# Patient Record
Sex: Female | Born: 1970 | Race: Black or African American | Hispanic: No | Marital: Single | Smoking: Never smoker
Health system: Southern US, Community
[De-identification: ages and names within clinical notes are randomized; demographics above are authoritative.]

## PROBLEM LIST (undated history)

## (undated) DIAGNOSIS — G43909 Migraine, unspecified, not intractable, without status migrainosus: Secondary | ICD-10-CM

## (undated) DIAGNOSIS — R0600 Dyspnea, unspecified: Secondary | ICD-10-CM

## (undated) DIAGNOSIS — Z973 Presence of spectacles and contact lenses: Secondary | ICD-10-CM

## (undated) DIAGNOSIS — D649 Anemia, unspecified: Secondary | ICD-10-CM

## (undated) DIAGNOSIS — R51 Headache: Secondary | ICD-10-CM

## (undated) DIAGNOSIS — R011 Cardiac murmur, unspecified: Secondary | ICD-10-CM

## (undated) DIAGNOSIS — R519 Headache, unspecified: Secondary | ICD-10-CM

## (undated) DIAGNOSIS — R002 Palpitations: Secondary | ICD-10-CM

## (undated) HISTORY — DX: Migraine, unspecified, not intractable, without status migrainosus: G43.909

## (undated) HISTORY — PX: TUBAL LIGATION: SHX77

## (undated) HISTORY — PX: WISDOM TOOTH EXTRACTION: SHX21

---

## 1999-03-21 ENCOUNTER — Emergency Department (HOSPITAL_COMMUNITY): Admission: EM | Admit: 1999-03-21 | Discharge: 1999-03-21 | Payer: Self-pay | Admitting: Emergency Medicine

## 1999-05-21 ENCOUNTER — Other Ambulatory Visit: Admission: RE | Admit: 1999-05-21 | Discharge: 1999-05-21 | Payer: Self-pay | Admitting: *Deleted

## 1999-07-06 ENCOUNTER — Observation Stay (HOSPITAL_COMMUNITY): Admission: AD | Admit: 1999-07-06 | Discharge: 1999-07-07 | Payer: Self-pay | Admitting: Obstetrics and Gynecology

## 1999-07-07 ENCOUNTER — Encounter: Payer: Self-pay | Admitting: Obstetrics and Gynecology

## 1999-08-20 ENCOUNTER — Inpatient Hospital Stay (HOSPITAL_COMMUNITY): Admission: AD | Admit: 1999-08-20 | Discharge: 1999-08-20 | Payer: Self-pay | Admitting: *Deleted

## 1999-08-26 HISTORY — PX: TUBAL LIGATION: SHX77

## 1999-08-29 ENCOUNTER — Encounter: Payer: Self-pay | Admitting: Obstetrics & Gynecology

## 1999-08-29 ENCOUNTER — Ambulatory Visit (HOSPITAL_COMMUNITY): Admission: RE | Admit: 1999-08-29 | Discharge: 1999-08-29 | Payer: Self-pay | Admitting: Obstetrics & Gynecology

## 1999-09-03 ENCOUNTER — Encounter (HOSPITAL_COMMUNITY): Admission: RE | Admit: 1999-09-03 | Discharge: 1999-10-23 | Payer: Self-pay | Admitting: *Deleted

## 1999-09-15 ENCOUNTER — Inpatient Hospital Stay (HOSPITAL_COMMUNITY): Admission: AD | Admit: 1999-09-15 | Discharge: 1999-09-15 | Payer: Self-pay | Admitting: Obstetrics and Gynecology

## 1999-09-23 ENCOUNTER — Inpatient Hospital Stay (HOSPITAL_COMMUNITY): Admission: AD | Admit: 1999-09-23 | Discharge: 1999-09-23 | Payer: Self-pay | Admitting: *Deleted

## 1999-09-29 ENCOUNTER — Encounter: Payer: Self-pay | Admitting: Obstetrics and Gynecology

## 1999-09-29 ENCOUNTER — Inpatient Hospital Stay (HOSPITAL_COMMUNITY): Admission: AD | Admit: 1999-09-29 | Discharge: 1999-09-29 | Payer: Self-pay | Admitting: Obstetrics and Gynecology

## 1999-10-14 ENCOUNTER — Inpatient Hospital Stay (HOSPITAL_COMMUNITY): Admission: AD | Admit: 1999-10-14 | Discharge: 1999-10-14 | Payer: Self-pay | Admitting: Obstetrics and Gynecology

## 1999-10-22 ENCOUNTER — Inpatient Hospital Stay (HOSPITAL_COMMUNITY): Admission: AD | Admit: 1999-10-22 | Discharge: 1999-10-24 | Payer: Self-pay | Admitting: Obstetrics and Gynecology

## 1999-11-28 ENCOUNTER — Other Ambulatory Visit: Admission: RE | Admit: 1999-11-28 | Discharge: 1999-11-28 | Payer: Self-pay | Admitting: *Deleted

## 1999-11-30 ENCOUNTER — Ambulatory Visit (HOSPITAL_COMMUNITY): Admission: RE | Admit: 1999-11-30 | Discharge: 1999-11-30 | Payer: Self-pay | Admitting: *Deleted

## 2002-01-03 ENCOUNTER — Other Ambulatory Visit: Admission: RE | Admit: 2002-01-03 | Discharge: 2002-01-03 | Payer: Self-pay | Admitting: Obstetrics and Gynecology

## 2002-02-02 ENCOUNTER — Encounter: Payer: Self-pay | Admitting: Obstetrics and Gynecology

## 2002-02-02 ENCOUNTER — Encounter: Admission: RE | Admit: 2002-02-02 | Discharge: 2002-02-02 | Payer: Self-pay | Admitting: Obstetrics and Gynecology

## 2006-11-09 ENCOUNTER — Encounter: Admission: RE | Admit: 2006-11-09 | Discharge: 2006-11-09 | Payer: Self-pay | Admitting: Obstetrics

## 2007-02-14 ENCOUNTER — Emergency Department (HOSPITAL_COMMUNITY): Admission: EM | Admit: 2007-02-14 | Discharge: 2007-02-14 | Payer: Self-pay | Admitting: Emergency Medicine

## 2009-09-14 ENCOUNTER — Other Ambulatory Visit: Admission: RE | Admit: 2009-09-14 | Discharge: 2009-09-14 | Payer: Self-pay | Admitting: Family Medicine

## 2009-09-27 ENCOUNTER — Ambulatory Visit (HOSPITAL_COMMUNITY): Admission: RE | Admit: 2009-09-27 | Discharge: 2009-09-27 | Payer: Self-pay | Admitting: Family Medicine

## 2010-11-12 ENCOUNTER — Other Ambulatory Visit (HOSPITAL_COMMUNITY)
Admission: RE | Admit: 2010-11-12 | Discharge: 2010-11-12 | Disposition: A | Discharge status: Home / Self Care | Payer: Medicaid Other | Source: Ambulatory Visit | Attending: Family Medicine | Admitting: Family Medicine

## 2010-11-12 DIAGNOSIS — Z01419 Encounter for gynecological examination (general) (routine) without abnormal findings: Secondary | ICD-10-CM | POA: Insufficient documentation

## 2010-11-12 DIAGNOSIS — Z1159 Encounter for screening for other viral diseases: Secondary | ICD-10-CM | POA: Insufficient documentation

## 2010-11-13 ENCOUNTER — Other Ambulatory Visit (HOSPITAL_COMMUNITY): Payer: Self-pay | Admitting: Family Medicine

## 2010-11-13 DIAGNOSIS — Z1231 Encounter for screening mammogram for malignant neoplasm of breast: Secondary | ICD-10-CM

## 2010-11-21 ENCOUNTER — Ambulatory Visit (HOSPITAL_COMMUNITY): Payer: Medicaid Other | Attending: Family Medicine

## 2010-12-18 ENCOUNTER — Ambulatory Visit (HOSPITAL_COMMUNITY)
Admission: RE | Admit: 2010-12-18 | Discharge: 2010-12-18 | Disposition: A | Discharge status: Home / Self Care | Payer: Medicaid Other | Source: Ambulatory Visit | Attending: Family Medicine | Admitting: Family Medicine

## 2010-12-18 DIAGNOSIS — Z1231 Encounter for screening mammogram for malignant neoplasm of breast: Secondary | ICD-10-CM

## 2011-01-10 NOTE — Op Note (Signed)
[facility]  Patient:    Teresa Lynch, Teresa Lynch                        MRN: [id number] Proc. Date: 11/30/99 Adm. Date:  [id number] Attending:  Donne Hazel                           Operative Report  PREOPERATIVE DIAGNOSIS:       Requests permanent, voluntary sterilization.  POSTOPERATIVE DIAGNOSIS:      Requests permanent, voluntary sterilization.  OPERATION:                    Laparoscopy.  Bilateral tubal ligation by electrofulguration.  SURGEON:                      Willey Blade, M.D.  ASSISTANT:  ANESTHESIA:                   General endotracheal anesthesia.  ESTIMATED BLOOD LOSS:         Less than 20 cc.  COMPLICATIONS:                None.  FINDINGS:                     At the time of laparoscopy, the pelvis was visualized.  The right tube appeared to be adequately ligated.  The left tube had a fimbriated end and was quite long in length.  There was a distal Falope ring of  old, but proximal to this, there was a fimbriated end of the tube with quite some length to it.  The ovaries and uterus were otherwise visualized and noted to be  normal.  The bowel was visualized and noted to be normal.  The appendix was not  identified.  DESCRIPTION OF PROCEDURE:     The patient was taken to the operating room where a general endotracheal anesthetic was administered.  The patient was placed on the operating table in the dorsal lithotomy position.  The abdomen, perineum, and vagina was prepped and draped in the usual sterile fashion with Betadine and sterile drapes.  A Hulka tenaculum was placed inside the intrauterine cavity.  red rubber catheter was used to empty the bladder.  Next, the abdomen was entered through a small transverse infraumbilical skin incision.  A Veress needle was inserted atraumatically into the abdominal cavity and a pneumoperitoneum created. Approximately 1.5 liters of carbon dioxide gas was used to insufflate  the abdomen. After a pneumoperitoneum was created, the Veress needle was removed.  Next, a disposable laparoscopic trocar was inserted atraumatically into the abdominal cavity.  The laparoscope was then inserted with the above noted findings. Carbon dioxide was used to insufflate the abdomen.  First the right tube was ligated.  This was about 3 cm in size at the proximal end.  This was interrupted with what looks like a Falope ring.  The proximal portion of the tube was then cauterized thoroughly in its distal 2 cm.  This was done with the bipolar cautery. This distal 2/3 was thoroughly cauterized until no current passed between the bipolar electrodes.  Next, attention was turned to the left tube.  This is more  than likely the tube that failed for her.  An approximate 3.5 cm segment of this tube was thoroughly cauterized with about four lengths paddelwidths of the Kleppinger bipolar  cautery.  This was done thoroughly and completely incorporating some of the mesosalpinx.  Representative pictures were then taken.  The laparoscope was removed and the gas allowed to escape from the abdomen.  All abdominal instruments were removed.  The small incision was closed first with two interrupted 0 Vicryl sutures on the fascia.  The skin was reapproximated with a running subcuticular stitch of 4-0 Vicryl Rapide.  All vaginal instruments were removed. The patient was awakened, extubated, and taken to the recovery room in good condition.  There were no perioperative complications. DD:  11/30/99 TD:  11/30/99 Job: [id number]

## 2011-01-10 NOTE — H&P (Signed)
[facility]  Patient:    Teresa Lynch, Teresa Lynch                        MRN: [id number] Adm. Date:  [id number] Attending:  Donne Hazel                         History and Physical  HISTORY OF PRESENT ILLNESS:   Ms. Tipping is a 40 year old female, gravida 5, para 5, who is admitted at this time to undergo permanent, voluntary sterilization. The patient had a tubal ligation in 1995 which has failed her.  She has had two vaginal deliveries after her first tubal ligation.  After informed consent, the patient is once again admitted for laparoscopic bilateral tubal ligation.  Risks and benefits as well as failure rate explained.  PAST OBSTETRIC HISTORY:       Normal spontaneous vaginal delivery x 5.  PAST SURGICAL HISTORY:        Bilateral tubal ligation.  PAST MEDICAL HISTORY:         None.  MEDICATIONS:                  1. Vitamins.                               2. Iron.  PHYSICAL EXAMINATION:  VITAL SIGNS:                  Stable.  Blood pressure 114/68, temperature 97.1,  pulse 77, respirations 20.  GENERAL:                      She is a well-developed, well-nourished female in no acute distress.  HEENT:                        Within normal limits.  NECK:                         Supple without adenopathy or thyromegaly.  HEART:                        Regular rate and rhythm without murmurs, rubs, or  gallops.  LUNGS:                        Clear to auscultation.  BREASTS:                      Done recently in the office was normal, but is deferred upon admission.  ABDOMEN:                      Benign with bowel sounds positive.  The abdomen is soft without tenderness, masses, or hernia.  EXTREMITIES: NEUROLOGICAL:    Grossly normal.  PELVIC:                       Normal external, parous female genitalia noted. The vagina and cervix are clear.  The uterus is small, nontender, and mobile. he adnexa are clear.  ADMISSION  DIAGNOSES:          Requests permanent, voluntary sterilization.  PLAN:  1) Laparoscopic bilateral tubal ligation by electrofulguration.  DISCUSSION:                   The risks and benefits of laparoscopy and bilateral tubal ligation discussed.  The risks of bleeding, infection, risks of injury to  surrounding organs discussed at length.  The patient had a previous tubal ligation which failed her.  After informed consent, the patient is now admitted for laparoscopic bilateral tubal ligation with electrofulguration.  The failure rate and procedure discussed. DD:  11/30/99 TD:  11/30/99 Job: [id number]

## 2011-06-11 LAB — WET PREP, GENITAL: Clue Cells Wet Prep HPF POC: NONE SEEN

## 2011-06-11 LAB — HEMOGLOBIN AND HEMATOCRIT, BLOOD
HCT: 32.7 — ABNORMAL LOW
Hemoglobin: 10.7 — ABNORMAL LOW

## 2011-06-11 LAB — GC/CHLAMYDIA PROBE AMP, GENITAL: GC Probe Amp, Genital: NEGATIVE

## 2012-03-01 ENCOUNTER — Emergency Department (HOSPITAL_COMMUNITY)
Admission: EM | Admit: 2012-03-01 | Discharge: 2012-03-01 | Disposition: A | Discharge status: Home Health | Payer: Medicaid Other | Attending: Emergency Medicine | Admitting: Emergency Medicine

## 2012-03-01 ENCOUNTER — Encounter (HOSPITAL_COMMUNITY): Payer: Self-pay | Admitting: Emergency Medicine

## 2012-03-01 DIAGNOSIS — N390 Urinary tract infection, site not specified: Secondary | ICD-10-CM | POA: Insufficient documentation

## 2012-03-01 LAB — POCT PREGNANCY, URINE: Preg Test, Ur: NEGATIVE

## 2012-03-01 LAB — URINALYSIS, ROUTINE W REFLEX MICROSCOPIC
Nitrite: POSITIVE — AB
Specific Gravity, Urine: 1.025 (ref 1.005–1.030)
Urobilinogen, UA: 1 mg/dL (ref 0.0–1.0)
pH: 5.5 (ref 5.0–8.0)

## 2012-03-01 LAB — URINE MICROSCOPIC-ADD ON

## 2012-03-01 MED ORDER — DEXTROSE 5 % IV SOLN
1.0000 g | Freq: Once | INTRAVENOUS | Status: AC
  Administered 2012-03-01: 1 g via INTRAVENOUS
  Filled 2012-03-01: qty 10

## 2012-03-01 MED ORDER — NITROFURANTOIN MONOHYD MACRO 100 MG PO CAPS: 100.0000 mg | ORAL_CAPSULE | Freq: Two times a day (BID) | ORAL | Status: AC

## 2012-03-01 MED ORDER — ONDANSETRON HCL 4 MG/2ML IJ SOLN
INTRAMUSCULAR | Status: AC
  Filled 2012-03-01: qty 2

## 2012-03-01 MED ORDER — ONDANSETRON HCL 4 MG PO TABS: 4.0000 mg | ORAL_TABLET | Freq: Four times a day (QID) | ORAL | Status: AC

## 2012-03-01 MED ORDER — SODIUM CHLORIDE 0.9 % IV BOLUS (SEPSIS)
500.0000 mL | Freq: Once | INTRAVENOUS | Status: AC
  Administered 2012-03-01: 500 mL via INTRAVENOUS

## 2012-03-01 MED ORDER — HYDROMORPHONE HCL PF 1 MG/ML IJ SOLN
1.0000 mg | Freq: Once | INTRAMUSCULAR | Status: AC
  Administered 2012-03-01: 1 mg via INTRAVENOUS
  Filled 2012-03-01: qty 1

## 2012-03-01 MED ORDER — OXYCODONE-ACETAMINOPHEN 5-325 MG PO TABS: 1.0000 | ORAL_TABLET | ORAL | Status: AC | PRN

## 2012-03-01 MED ORDER — ONDANSETRON HCL 4 MG/2ML IJ SOLN
4.0000 mg | Freq: Once | INTRAMUSCULAR | Status: AC
  Administered 2012-03-01: 4 mg via INTRAVENOUS

## 2012-03-01 NOTE — ED Provider Notes (Signed)
History     CSN: [id number]  Arrival date & time 03/01/12  1623   None     Chief Complaint  Patient presents with  . Urinary Tract Infection    (Consider location/radiation/quality/duration/timing/severity/associated sxs/prior treatment) Patient is a 41 y.o. female presenting with urinary tract infection. The history is provided by the patient. No language interpreter was used.  Urinary Tract Infection This is a new problem. The current episode started in the past 7 days. The problem occurs daily. The problem has been gradually worsening. Associated symptoms include chills, nausea and urinary symptoms. Pertinent negatives include no fever or vomiting. Exacerbated by: voiding. Treatments tried: azo.  States that Sunday at 1am started having R flank pain. Burning with urination started 6am today.    States that she went to [facility] and got azo tabs and cystex pills for ? Uti.  Pain above her r flank.  No pmh of kidney stone. pcp Dr. [facility].  No fever.   History reviewed. No pertinent past medical history.  History reviewed. No pertinent past surgical history.  History reviewed. No pertinent family history.  History  Substance Use Topics  . Smoking status: Never Smoker   . Smokeless tobacco: Not on file  . Alcohol Use: No    OB History    Grav Para Term Preterm Abortions TAB SAB Ect Mult Living                  Review of Systems  Constitutional: Positive for chills. Negative for fever.  HENT: Negative.   Eyes: Negative.   Respiratory: Negative.   Cardiovascular: Negative.   Gastrointestinal: Positive for nausea. Negative for vomiting.  Genitourinary: Positive for dysuria, urgency, frequency and flank pain. Negative for hematuria and vaginal discharge.  Neurological: Negative.  Negative for dizziness.  Psychiatric/Behavioral: Negative.   All other systems reviewed and are negative.    Allergies  Review of patient's allergies indicates no known allergies.  Home  Medications   Current Outpatient Rx  Name Route Sig Dispense Refill  . CYSTEX PO Oral Take 2 tablets by mouth daily.    . AZO TABS PO Oral Take 2 tablets by mouth 2 (two) times daily.      BP 140/80  Pulse 91  Temp 98.5 F (36.9 C) (Oral)  Resp 20  SpO2 98%  Physical Exam  Nursing note and vitals reviewed. Constitutional: She is oriented to person, place, and time. She appears well-developed and well-nourished.  HENT:  Head: Normocephalic and atraumatic.  Eyes: Conjunctivae and EOM are normal. Pupils are equal, round, and reactive to light.  Neck: Normal range of motion. Neck supple.  Cardiovascular: Normal rate.   Pulmonary/Chest: Effort normal and breath sounds normal. No respiratory distress.  Abdominal: Soft. Bowel sounds are normal. There is tenderness.       suprpubic tenderness  Musculoskeletal: Normal range of motion. She exhibits no edema and no tenderness.  Neurological: She is alert and oriented to person, place, and time. She has normal reflexes.  Skin: Skin is warm and dry.  Psychiatric: She has a normal mood and affect.    ED Course  Procedures (including critical care time)  Labs Reviewed  URINALYSIS, ROUTINE W REFLEX MICROSCOPIC - Abnormal; Notable for the following:    Color, Urine ORANGE (*)  BIOCHEMICALS MAY BE AFFECTED BY COLOR   APPearance HAZY (*)     Ketones, ur 15 (*)     Nitrite POSITIVE (*)     Leukocytes, UA LARGE (*)  All other components within normal limits  URINE MICROSCOPIC-ADD ON - Abnormal; Notable for the following:    Squamous Epithelial / LPF FEW (*)     Bacteria, UA MANY (*)     All other components within normal limits  POCT PREGNANCY, URINE   No results found.   No diagnosis found.    MDM  + UTI rocephen 1gm IV in the ER.  Macrobid perocet and zofran rx.  Follow up with pcp of choice.  Return if persistant Flank pain, nausea vomiting or fever  for possible CT.     Labs Reviewed  URINALYSIS, ROUTINE W REFLEX  MICROSCOPIC - Abnormal; Notable for the following:    Color, Urine ORANGE (*)  BIOCHEMICALS MAY BE AFFECTED BY COLOR   APPearance HAZY (*)     Ketones, ur 15 (*)     Nitrite POSITIVE (*)     Leukocytes, UA LARGE (*)     All other components within normal limits  URINE MICROSCOPIC-ADD ON - Abnormal; Notable for the following:    Squamous Epithelial / LPF FEW (*)     Bacteria, UA MANY (*)     All other components within normal limits  POCT PREGNANCY, URINE  LAB REPORT - SCANNED          Remi Haggard, NP 03/02/12 1311

## 2012-03-01 NOTE — ED Notes (Addendum)
Pt c/o UTI sx with pain in left flank and urgency with burning with urination x 2 days; pt sts foul smell noted also; pt sts started taking azo yesterday

## 2012-03-04 NOTE — ED Provider Notes (Signed)
Medical screening examination/treatment/procedure(s) were performed by non-physician practitioner and as supervising physician I was immediately available for consultation/collaboration.  Ethelda Chick, MD 03/04/12 [phone]

## 2013-08-04 ENCOUNTER — Other Ambulatory Visit (HOSPITAL_COMMUNITY)
Admission: RE | Admit: 2013-08-04 | Discharge: 2013-08-04 | Disposition: A | Discharge status: Home / Self Care | Payer: BC Managed Care – PPO | Source: Ambulatory Visit | Attending: Family Medicine | Admitting: Family Medicine

## 2013-08-04 DIAGNOSIS — Z01419 Encounter for gynecological examination (general) (routine) without abnormal findings: Secondary | ICD-10-CM | POA: Insufficient documentation

## 2013-08-04 DIAGNOSIS — Z1151 Encounter for screening for human papillomavirus (HPV): Secondary | ICD-10-CM | POA: Insufficient documentation

## 2014-04-07 ENCOUNTER — Other Ambulatory Visit (HOSPITAL_COMMUNITY): Payer: Self-pay | Admitting: Family Medicine

## 2014-04-07 DIAGNOSIS — Z1231 Encounter for screening mammogram for malignant neoplasm of breast: Secondary | ICD-10-CM

## 2014-04-13 ENCOUNTER — Ambulatory Visit (HOSPITAL_COMMUNITY): Payer: BC Managed Care – PPO | Attending: Family Medicine

## 2014-10-09 ENCOUNTER — Encounter (HOSPITAL_COMMUNITY): Payer: Self-pay | Admitting: *Deleted

## 2014-10-09 ENCOUNTER — Emergency Department (HOSPITAL_COMMUNITY)
Admission: EM | Admit: 2014-10-09 | Discharge: 2014-10-09 | Disposition: A | Discharge status: Home / Self Care | Payer: BLUE CROSS/BLUE SHIELD | Source: Home / Self Care | Attending: Family Medicine | Admitting: Family Medicine

## 2014-10-09 ENCOUNTER — Other Ambulatory Visit (HOSPITAL_COMMUNITY)
Admission: RE | Admit: 2014-10-09 | Discharge: 2014-10-09 | Disposition: A | Discharge status: Home / Self Care | Payer: BLUE CROSS/BLUE SHIELD | Source: Ambulatory Visit | Attending: Family Medicine | Admitting: Family Medicine

## 2014-10-09 DIAGNOSIS — Z113 Encounter for screening for infections with a predominantly sexual mode of transmission: Secondary | ICD-10-CM | POA: Diagnosis present

## 2014-10-09 DIAGNOSIS — N766 Ulceration of vulva: Secondary | ICD-10-CM

## 2014-10-09 DIAGNOSIS — N760 Acute vaginitis: Secondary | ICD-10-CM | POA: Diagnosis present

## 2014-10-09 MED ORDER — VALACYCLOVIR HCL 1 G PO TABS: 1000.0000 mg | ORAL_TABLET | Freq: Two times a day (BID) | ORAL | Status: DC

## 2014-10-09 MED ORDER — METRONIDAZOLE 500 MG PO TABS: 500.0000 mg | ORAL_TABLET | Freq: Two times a day (BID) | ORAL | Status: DC

## 2014-10-09 MED ORDER — FLUCONAZOLE 150 MG PO TABS: 150.0000 mg | ORAL_TABLET | Freq: Once | ORAL | Status: DC

## 2014-10-09 NOTE — Discharge Instructions (Signed)
Thank you for coming in today. Take medications as directed. We will call you if anything comes back positive. Return as needed   Bacterial Vaginosis Bacterial vaginosis is a vaginal infection that occurs when the normal balance of bacteria in the vagina is disrupted. It results from an overgrowth of certain bacteria. This is the most common vaginal infection in women of childbearing age. Treatment is important to prevent complications, especially in pregnant women, as it can cause a premature delivery. CAUSES  Bacterial vaginosis is caused by an increase in harmful bacteria that are normally present in smaller amounts in the vagina. Several different kinds of bacteria can cause bacterial vaginosis. However, the reason that the condition develops is not fully understood. RISK FACTORS Certain activities or behaviors can put you at an increased risk of developing bacterial vaginosis, including:  Having a new sex partner or multiple sex partners.  Douching.  Using an intrauterine device (IUD) for contraception. Women do not get bacterial vaginosis from toilet seats, bedding, swimming pools, or contact with objects around them. SIGNS AND SYMPTOMS  Some women with bacterial vaginosis have no signs or symptoms. Common symptoms include:  Grey vaginal discharge.  A fishlike odor with discharge, especially after sexual intercourse.  Itching or burning of the vagina and vulva.  Burning or pain with urination. DIAGNOSIS  Your health care provider will take a medical history and examine the vagina for signs of bacterial vaginosis. A sample of vaginal fluid may be taken. Your health care provider will look at this sample under a microscope to check for bacteria and abnormal cells. A vaginal pH test may also be done.  TREATMENT  Bacterial vaginosis may be treated with antibiotic medicines. These may be given in the form of a pill or a vaginal cream. A second round of antibiotics may be prescribed  if the condition comes back after treatment.  HOME CARE INSTRUCTIONS   Only take over-the-counter or prescription medicines as directed by your health care provider.  If antibiotic medicine was prescribed, take it as directed. Make sure you finish it even if you start to feel better.  Do not have sex until treatment is completed.  Tell all sexual partners that you have a vaginal infection. They should see their health care provider and be treated if they have problems, such as a mild rash or itching.  Practice safe sex by using condoms and only having one sex partner. SEEK MEDICAL CARE IF:   Your symptoms are not improving after 3 days of treatment.  You have increased discharge or pain.  You have a fever. MAKE SURE YOU:   Understand these instructions.  Will watch your condition.  Will get help right away if you are not doing well or get worse. FOR MORE INFORMATION  Centers for Disease Control and Prevention, Division of STD Prevention: AppraiserFraud.fi American Sexual Health Association (ASHA): www.ashastd.org  Document Released: 08/11/2005 Document Revised: 06/01/2013 Document Reviewed: 03/23/2013 [facility] Patient Information 2015 [place], [place]. This information is not intended to replace advice given to you by your health care provider. Make sure you discuss any questions you have with your health care provider.   Genital Herpes Genital herpes is a sexually transmitted disease. This means that it is a disease passed by having sex with an infected person. There is no cure for genital herpes. The time between attacks can be months to years. The virus may live in a person but produce no problems (symptoms). This infection can be passed to a  baby as it travels down the birth canal (vagina). In a newborn, this can cause central nervous system damage, eye damage, or even death. The virus that causes genital herpes is usually HSV-2 virus. The virus that causes oral herpes is usually  HSV-1. The diagnosis (learning what is wrong) is made through culture results. SYMPTOMS  Usually symptoms of pain and itching begin a few days to a week after contact. It first appears as small blisters that progress to small painful ulcers which then scab over and heal after several days. It affects the outer genitalia, birth canal, cervix, penis, anal area, buttocks, and thighs. HOME CARE INSTRUCTIONS   Keep ulcerated areas dry and clean.  Take medications as directed. Antiviral medications can speed up healing. They will not prevent recurrences or cure this infection. These medications can also be taken for suppression if there are frequent recurrences.  While the infection is active, it is contagious. Avoid all sexual contact during active infections.  Condoms may help prevent spread of the herpes virus.  Practice safe sex.  Wash your hands thoroughly after touching the genital area.  Avoid touching your eyes after touching your genital area.  Inform your caregiver if you have had genital herpes and become pregnant. It is your responsibility to insure a safe outcome for your baby in this pregnancy.  Only take over-the-counter or prescription medicines for pain, discomfort, or fever as directed by your caregiver. SEEK MEDICAL CARE IF:   You have a recurrence of this infection.  You do not respond to medications and are not improving.  You have new sources of pain or discharge which have changed from the original infection.  You have an oral temperature above 102 F (38.9 C).  You develop abdominal pain.  You develop eye pain or signs of eye infection. Document Released: 08/08/2000 Document Revised: 11/03/2011 Document Reviewed: 08/29/2009 [facility] Patient Information 2015 [place], [place]. This information is not intended to replace advice given to you by your health care provider. Make sure you discuss any questions you have with your health care provider.

## 2014-10-09 NOTE — ED Provider Notes (Addendum)
Teresa Lynch is a 44 y.o. female who presents to Urgent Care today for vaginal labial ulcer present for about 5 days and quite painful. Patient also notes mild vaginal discharge. No fevers or chills nausea vomiting or diarrhea. No treatments tried yet. No new sexual partners. Patient notes that she has not had sex since October.    No past medical history on file. No past surgical history on file. History  Substance Use Topics  . Smoking status: Never Smoker   . Smokeless tobacco: Not on file  . Alcohol Use: No   ROS as above Medications: No current facility-administered medications for this encounter.   Current Outpatient Prescriptions  Medication Sig Dispense Refill  . fluconazole (DIFLUCAN) 150 MG tablet Take 1 tablet (150 mg total) by mouth once. 1 tablet 1  . Methenamine-Sodium Salicylate (CYSTEX PO) Take 2 tablets by mouth daily.    . metroNIDAZOLE (FLAGYL) 500 MG tablet Take 1 tablet (500 mg total) by mouth 2 (two) times daily. 14 tablet 0  . Phenazopyridine HCl (AZO TABS PO) Take 2 tablets by mouth 2 (two) times daily.    . valACYclovir (VALTREX) 1000 MG tablet Take 1 tablet (1,000 mg total) by mouth 2 (two) times daily. 14 tablet 0   No Known Allergies   Exam:  BP 131/89 mmHg  Pulse 91  Temp(Src) 98.4 F (36.9 C) (Oral)  Resp 16  SpO2 100% Gen: Well NAD  GYN: External genitalia with shallow less than 1 cm ulceration at the right inferior labia inner to touch. Vaginal canal with whitish discharge. Cervix is nontender normal appearing  No results found for this or any previous visit (from the past 24 hour(s)). No results found.  Assessment and Plan: 44 y.o. female with vaginal ulceration and discharge. This is concerning for genital herpes. She has never had an outbreak so this is a primary outbreak. Marland Kitchen HSV culture, vaginal cytology and HIV/RPR pending. Treat with Valtrex, Flagyl, and fluconazole.  Discussed warning signs or symptoms. Please see discharge  instructions. Patient expresses understanding.     Gregor Hams, MD 10/09/14 [place], MD 10/09/14 [phone]

## 2014-10-09 NOTE — ED Notes (Signed)
C/o pain on R labia onset Thur. and noted a bump there on Saturday.  Has clear vaginal discharge onset Wed.  Burns when she passes her urine because the bump is there.

## 2014-10-11 ENCOUNTER — Telehealth (HOSPITAL_COMMUNITY): Payer: Self-pay | Admitting: *Deleted

## 2014-10-11 LAB — CERVICOVAGINAL ANCILLARY ONLY
CHLAMYDIA, DNA PROBE: NEGATIVE
Neisseria Gonorrhea: NEGATIVE
WET PREP (BD AFFIRM): NEGATIVE
Wet Prep (BD Affirm): NEGATIVE
Wet Prep (BD Affirm): POSITIVE — AB

## 2014-10-11 LAB — HERPES SIMPLEX VIRUS CULTURE: CULTURE: DETECTED

## 2014-10-11 LAB — HIV ANTIBODY (ROUTINE TESTING W REFLEX): HIV SCREEN 4TH GENERATION: NONREACTIVE

## 2014-10-11 NOTE — ED Notes (Addendum)
GC/Chlamydia neg., Affirm: Candida and Gardnerella neg., Trich pos., Herpes culture: Herpes Simplex Type 2 detected, HIV non-reactive.  Pt. adequately treated with Flagyl and Valtrex.  I called pt. and left a message to call.  Call 1. Roselyn Meier 10/11/2014

## 2014-10-12 LAB — RPR: RPR Ser Ql: NONREACTIVE

## 2014-10-13 NOTE — ED Notes (Addendum)
RPR non-reactive.  I called pt. and left a message to call.  Call 2. 10/13/2014 I called pt. Pt. verified x 2 and given results.  Pt. told she was adequately treated for Trich with Flagyl and Valtrex for the Herpes.  Pt. instructed to notify her partner of both infections, that she can pass the Herpes virus even when she doesn't have an outbreak, so always practice safe sex and to get treated for each outbreak with Acyclovir or Valtrex. Instructed to tell her PCP, and he can give her a 1 yr. Rx. to take at the beginning of each outbreak. he can also give suppressive treatment if having frequent outbreaks. Pt. voiced understanding. Roselyn Meier 10/17/2014

## 2015-05-23 ENCOUNTER — Other Ambulatory Visit: Payer: Self-pay | Admitting: Obstetrics & Gynecology

## 2015-06-04 ENCOUNTER — Encounter (HOSPITAL_COMMUNITY): Payer: Self-pay

## 2015-06-05 ENCOUNTER — Encounter (HOSPITAL_COMMUNITY): Payer: Self-pay | Admitting: Anesthesiology

## 2015-07-05 ENCOUNTER — Ambulatory Visit (HOSPITAL_COMMUNITY)
Admission: RE | Admit: 2015-07-05 | Payer: BLUE CROSS/BLUE SHIELD | Source: Ambulatory Visit | Admitting: Obstetrics & Gynecology

## 2015-07-05 ENCOUNTER — Encounter (HOSPITAL_COMMUNITY): Admission: RE | Payer: Self-pay | Source: Ambulatory Visit

## 2015-07-05 HISTORY — DX: Headache: R51

## 2015-07-05 HISTORY — DX: Anemia, unspecified: D64.9

## 2015-07-05 HISTORY — DX: Headache, unspecified: R51.9

## 2015-07-05 HISTORY — DX: Cardiac murmur, unspecified: R01.1

## 2015-07-05 SURGERY — DILATATION & CURETTAGE/HYSTEROSCOPY WITH NOVASURE ABLATION
Anesthesia: Choice

## 2015-08-07 ENCOUNTER — Encounter (HOSPITAL_COMMUNITY): Payer: Self-pay

## 2015-08-17 ENCOUNTER — Ambulatory Visit (HOSPITAL_COMMUNITY)
Admission: RE | Admit: 2015-08-17 | Payer: BLUE CROSS/BLUE SHIELD | Source: Ambulatory Visit | Admitting: Obstetrics & Gynecology

## 2015-08-17 SURGERY — DILATATION & CURETTAGE/HYSTEROSCOPY WITH NOVASURE ABLATION
Anesthesia: Choice

## 2015-09-09 ENCOUNTER — Emergency Department (HOSPITAL_COMMUNITY)
Admission: EM | Admit: 2015-09-09 | Discharge: 2015-09-09 | Disposition: A | Discharge status: Home / Self Care | Payer: Managed Care, Other (non HMO) | Source: Home / Self Care | Attending: Emergency Medicine | Admitting: Emergency Medicine

## 2015-09-09 ENCOUNTER — Encounter (HOSPITAL_COMMUNITY): Payer: Self-pay | Admitting: Emergency Medicine

## 2015-09-09 DIAGNOSIS — A499 Bacterial infection, unspecified: Secondary | ICD-10-CM

## 2015-09-09 DIAGNOSIS — N760 Acute vaginitis: Secondary | ICD-10-CM | POA: Diagnosis not present

## 2015-09-09 DIAGNOSIS — R112 Nausea with vomiting, unspecified: Secondary | ICD-10-CM

## 2015-09-09 DIAGNOSIS — B9689 Other specified bacterial agents as the cause of diseases classified elsewhere: Secondary | ICD-10-CM

## 2015-09-09 LAB — POCT URINALYSIS DIP (DEVICE)
Bilirubin Urine: NEGATIVE
Glucose, UA: NEGATIVE mg/dL
Leukocytes, UA: NEGATIVE
NITRITE: NEGATIVE
PH: 7 (ref 5.0–8.0)
PROTEIN: NEGATIVE mg/dL
SPECIFIC GRAVITY, URINE: 1.02 (ref 1.005–1.030)
UROBILINOGEN UA: 2 mg/dL — AB (ref 0.0–1.0)

## 2015-09-09 MED ORDER — FLUCONAZOLE 150 MG PO TABS
150.0000 mg | ORAL_TABLET | Freq: Once | ORAL | Status: DC
Start: 2015-09-09 — End: 2015-10-18

## 2015-09-09 MED ORDER — ONDANSETRON 4 MG PO TBDP
4.0000 mg | ORAL_TABLET | Freq: Once | ORAL | Status: AC
  Administered 2015-09-09: 4 mg via ORAL

## 2015-09-09 MED ORDER — METRONIDAZOLE 500 MG PO TABS: 500.0000 mg | ORAL_TABLET | Freq: Two times a day (BID) | ORAL | Status: DC

## 2015-09-09 MED ORDER — ONDANSETRON 4 MG PO TBDP
ORAL_TABLET | ORAL | Status: AC
  Filled 2015-09-09: qty 1

## 2015-09-09 MED ORDER — ONDANSETRON 4 MG PO TBDP: 4.0000 mg | ORAL_TABLET | Freq: Three times a day (TID) | ORAL | Status: DC | PRN

## 2015-09-09 NOTE — ED Provider Notes (Signed)
CSN: [id number]     Arrival date & time 09/09/15  1450 History   First MD Initiated Contact with Patient 09/09/15 1541     Chief Complaint  Patient presents with  . Emesis   (Consider location/radiation/quality/duration/timing/severity/associated sxs/prior Treatment) HPI  She is a 45 year old woman here for evaluation of vomiting. She states that she had about a weeklong episode of vomiting in December. This resolved, but has restarted 3 days ago. She states anytime she tries to eat or drink anything it just comes back up. She denies any diarrhea or constipation. She had a normal bowel movement yesterday. She denies abdominal pain, but does state her muscles feel sore from dry heaving. There has been no blood in the vomit.  She denies any fevers or chills. Denies any possibility of pregnancy.  Denies dizziness.  She also reports a vaginal discharge with a slightly fishy odor for the last week. No itching or pain.  Past Medical History  Diagnosis Date  . Heart murmur   . Headache     Migraines  . Anemia    Past Surgical History  Procedure Laterality Date  . Tubal ligation    . Wisdom tooth extraction     Family History  Problem Relation Age of Onset  . Cancer Mother     breast   Social History  Substance Use Topics  . Smoking status: Never Smoker   . Smokeless tobacco: Never Used  . Alcohol Use: Yes     Comment: social   OB History    No data available     Review of Systems As in history of present illness Allergies  Review of patient's allergies indicates no known allergies.  Home Medications   Prior to Admission medications   Medication Sig Start Date End Date Taking? Authorizing Provider  fluconazole (DIFLUCAN) 150 MG tablet Take 1 tablet (150 mg total) by mouth once. Take 2nd pill if symptoms not completely resolved in 3 days. 09/09/15   Melony Overly, MD  metroNIDAZOLE (FLAGYL) 500 MG tablet Take 1 tablet (500 mg total) by mouth 2 (two) times daily. 09/09/15   Melony Overly, MD  ondansetron (ZOFRAN-ODT) 4 MG disintegrating tablet Take 1 tablet (4 mg total) by mouth every 8 (eight) hours as needed for nausea or vomiting. 09/09/15   Melony Overly, MD   Meds Ordered and Administered this Visit   Medications  ondansetron (ZOFRAN-ODT) disintegrating tablet 4 mg (4 mg Oral Given 09/09/15 1613)    BP 149/89 mmHg  Pulse 99  Temp(Src) 98 F (36.7 C) (Oral)  SpO2 99%  LMP 08/02/2015 No data found.   Physical Exam  Constitutional: She is oriented to person, place, and time. She appears well-developed and well-nourished. No distress.  Cardiovascular: Normal rate, regular rhythm and normal heart sounds.   No murmur heard. Pulmonary/Chest: Effort normal and breath sounds normal. No respiratory distress. She has no wheezes. She has no rales.  Abdominal: Bowel sounds are normal. She exhibits no distension. There is no tenderness. There is no rebound and no guarding.  Genitourinary:  Pelvic exam deferred  Neurological: She is alert and oriented to person, place, and time.    ED Course  Procedures (including critical care time)  Labs Review Labs Reviewed  POCT URINALYSIS DIP (DEVICE) - Abnormal; Notable for the following:    Ketones, ur TRACE (*)    Hgb urine dipstick MODERATE (*)    Urobilinogen, UA 2.0 (*)    All other components within  normal limits    Imaging Review No results found.   MDM   1. Non-intractable vomiting with nausea, vomiting of unspecified type   2. BV (bacterial vaginosis)    She reports improved nausea after the Zofran. She is able to tolerate ginger ale here. Symptomatic treatment for likely viral stomach bug with Zofran. Flagyl for presumed BV. Prescription given for Diflucan to use if she develops symptoms of a yeast infection. Recommended follow-up with PCP if her vomiting does not improve over the next 3-4 days.    Melony Overly, MD 09/09/15 [phone]

## 2015-09-09 NOTE — ED Notes (Signed)
Pt reports intermittent emesis onset 12/22 that lasted x1 week Emesis restarted Thursday, 1/12, associated w/abd pain, chills Reports she has had x2 episodes of emesis today A&O x4... No acute distress.

## 2015-09-09 NOTE — Discharge Instructions (Signed)
You likely have a stomach virus causing your vomiting. Take Zofran every 8 hours as needed for nausea and vomiting. This should improve over the next 3-4 days.  Take Flagyl one pill twice a day for the discharge. Once you have finished the Flagyl, you can take the Diflucan if you develop symptoms of a yeast infection.  If your vomiting is persistent, please follow-up with your primary care doctor.

## 2015-10-03 ENCOUNTER — Other Ambulatory Visit: Payer: Self-pay | Admitting: Obstetrics & Gynecology

## 2015-10-04 ENCOUNTER — Other Ambulatory Visit: Payer: Self-pay | Admitting: Obstetrics & Gynecology

## 2015-10-08 ENCOUNTER — Encounter (HOSPITAL_COMMUNITY): Payer: Self-pay

## 2015-10-18 ENCOUNTER — Ambulatory Visit (HOSPITAL_COMMUNITY)
Admission: RE | Admit: 2015-10-18 | Discharge: 2015-10-18 | Disposition: A | Discharge status: Home / Self Care | Payer: Managed Care, Other (non HMO) | Source: Ambulatory Visit | Attending: Obstetrics & Gynecology | Admitting: Obstetrics & Gynecology

## 2015-10-18 ENCOUNTER — Ambulatory Visit (HOSPITAL_COMMUNITY): Payer: Managed Care, Other (non HMO) | Admitting: Anesthesiology

## 2015-10-18 ENCOUNTER — Encounter (HOSPITAL_COMMUNITY)
Admission: RE | Disposition: A | Discharge status: Home / Self Care | Payer: Self-pay | Source: Ambulatory Visit | Attending: Obstetrics & Gynecology

## 2015-10-18 ENCOUNTER — Encounter (HOSPITAL_COMMUNITY): Payer: Self-pay | Admitting: *Deleted

## 2015-10-18 DIAGNOSIS — N920 Excessive and frequent menstruation with regular cycle: Secondary | ICD-10-CM | POA: Insufficient documentation

## 2015-10-18 DIAGNOSIS — D500 Iron deficiency anemia secondary to blood loss (chronic): Secondary | ICD-10-CM | POA: Diagnosis not present

## 2015-10-18 DIAGNOSIS — N840 Polyp of corpus uteri: Secondary | ICD-10-CM | POA: Diagnosis not present

## 2015-10-18 HISTORY — PX: DILITATION & CURRETTAGE/HYSTROSCOPY WITH NOVASURE ABLATION: SHX5568

## 2015-10-18 LAB — CBC
HEMATOCRIT: 27.6 % — AB (ref 36.0–46.0)
Hemoglobin: 8.3 g/dL — ABNORMAL LOW (ref 12.0–15.0)
MCH: 22.1 pg — ABNORMAL LOW (ref 26.0–34.0)
MCHC: 30.1 g/dL (ref 30.0–36.0)
MCV: 73.4 fL — AB (ref 78.0–100.0)
Platelets: 410 10*3/uL — ABNORMAL HIGH (ref 150–400)
RBC: 3.76 MIL/uL — ABNORMAL LOW (ref 3.87–5.11)
RDW: 17.9 % — AB (ref 11.5–15.5)
WBC: 5.3 10*3/uL (ref 4.0–10.5)

## 2015-10-18 LAB — PREGNANCY, URINE: Preg Test, Ur: NEGATIVE

## 2015-10-18 SURGERY — DILATATION & CURETTAGE/HYSTEROSCOPY WITH NOVASURE ABLATION
Anesthesia: General | Site: Vagina

## 2015-10-18 MED ORDER — LIDOCAINE HCL (CARDIAC) 20 MG/ML IV SOLN
INTRAVENOUS | Status: AC
  Filled 2015-10-18: qty 5

## 2015-10-18 MED ORDER — OXYCODONE-ACETAMINOPHEN 5-325 MG PO TABS: 1.0000 | ORAL_TABLET | ORAL | Status: DC | PRN

## 2015-10-18 MED ORDER — LACTATED RINGERS IR SOLN
Status: DC | PRN
  Administered 2015-10-18: 3000 mL

## 2015-10-18 MED ORDER — CHLOROPROCAINE HCL 1 % IJ SOLN
INTRAMUSCULAR | Status: DC | PRN
  Administered 2015-10-18: 25 mL

## 2015-10-18 MED ORDER — PROMETHAZINE HCL 25 MG/ML IJ SOLN: 6.2500 mg | INTRAMUSCULAR | Status: DC | PRN

## 2015-10-18 MED ORDER — LACTATED RINGERS IV SOLN
INTRAVENOUS | Status: DC
  Administered 2015-10-18: 08:00:00 via INTRAVENOUS

## 2015-10-18 MED ORDER — DEXAMETHASONE SODIUM PHOSPHATE 4 MG/ML IJ SOLN
INTRAMUSCULAR | Status: AC
  Filled 2015-10-18: qty 1

## 2015-10-18 MED ORDER — FENTANYL CITRATE (PF) 100 MCG/2ML IJ SOLN
INTRAMUSCULAR | Status: AC
  Filled 2015-10-18: qty 2

## 2015-10-18 MED ORDER — LIDOCAINE HCL (CARDIAC) 20 MG/ML IV SOLN
INTRAVENOUS | Status: DC | PRN
  Administered 2015-10-18: 100 mg via INTRAVENOUS

## 2015-10-18 MED ORDER — FENTANYL CITRATE (PF) 100 MCG/2ML IJ SOLN
25.0000 ug | INTRAMUSCULAR | Status: DC | PRN
  Administered 2015-10-18: 25 ug via INTRAVENOUS

## 2015-10-18 MED ORDER — PROPOFOL 10 MG/ML IV BOLUS
INTRAVENOUS | Status: AC
  Filled 2015-10-18: qty 20

## 2015-10-18 MED ORDER — KETOROLAC TROMETHAMINE 30 MG/ML IJ SOLN
INTRAMUSCULAR | Status: DC | PRN
  Administered 2015-10-18: 30 mg via INTRAVENOUS

## 2015-10-18 MED ORDER — CHLOROPROCAINE HCL 1 % IJ SOLN
INTRAMUSCULAR | Status: AC
  Filled 2015-10-18: qty 30

## 2015-10-18 MED ORDER — KETOROLAC TROMETHAMINE 30 MG/ML IJ SOLN
INTRAMUSCULAR | Status: AC
  Filled 2015-10-18: qty 1

## 2015-10-18 MED ORDER — ONDANSETRON HCL 4 MG/2ML IJ SOLN
INTRAMUSCULAR | Status: DC | PRN
  Administered 2015-10-18: 4 mg via INTRAVENOUS

## 2015-10-18 MED ORDER — MIDAZOLAM HCL 2 MG/2ML IJ SOLN
INTRAMUSCULAR | Status: AC
  Filled 2015-10-18: qty 2

## 2015-10-18 MED ORDER — MIDAZOLAM HCL 5 MG/5ML IJ SOLN
INTRAMUSCULAR | Status: DC | PRN
  Administered 2015-10-18: 2 mg via INTRAVENOUS

## 2015-10-18 MED ORDER — OXYCODONE-ACETAMINOPHEN 7.5-325 MG PO TABS: 1.0000 | ORAL_TABLET | ORAL | Status: DC | PRN

## 2015-10-18 MED ORDER — DEXAMETHASONE SODIUM PHOSPHATE 4 MG/ML IJ SOLN
INTRAMUSCULAR | Status: DC | PRN
  Administered 2015-10-18: 4 mg via INTRAVENOUS

## 2015-10-18 MED ORDER — FENTANYL CITRATE (PF) 100 MCG/2ML IJ SOLN
INTRAMUSCULAR | Status: DC | PRN
  Administered 2015-10-18 (×2): 50 ug via INTRAVENOUS

## 2015-10-18 MED ORDER — ONDANSETRON HCL 4 MG/2ML IJ SOLN
INTRAMUSCULAR | Status: AC
  Filled 2015-10-18: qty 2

## 2015-10-18 MED ORDER — PROPOFOL 10 MG/ML IV BOLUS
INTRAVENOUS | Status: DC | PRN
  Administered 2015-10-18: 200 mg via INTRAVENOUS

## 2015-10-18 MED ORDER — SCOPOLAMINE 1 MG/3DAYS TD PT72
1.0000 | MEDICATED_PATCH | Freq: Once | TRANSDERMAL | Status: DC
  Administered 2015-10-18: 1.5 mg via TRANSDERMAL

## 2015-10-18 MED ORDER — SCOPOLAMINE 1 MG/3DAYS TD PT72
MEDICATED_PATCH | TRANSDERMAL | Status: AC
  Administered 2015-10-18: 1.5 mg via TRANSDERMAL
  Filled 2015-10-18: qty 1

## 2015-10-18 SURGICAL SUPPLY — 15 items
ABLATOR ENDOMETRIAL BIPOLAR (ABLATOR) ×3 IMPLANT
CATH ROBINSON RED A/P 16FR (CATHETERS) ×3 IMPLANT
CLOTH BEACON ORANGE TIMEOUT ST (SAFETY) ×3 IMPLANT
CONTAINER PREFILL 10% NBF 60ML (FORM) ×2 IMPLANT
GLOVE BIO SURGEON STRL SZ 6.5 (GLOVE) ×2 IMPLANT
GLOVE BIO SURGEONS STRL SZ 6.5 (GLOVE) ×1
GLOVE BIOGEL PI IND STRL 7.0 (GLOVE) ×3 IMPLANT
GLOVE BIOGEL PI INDICATOR 7.0 (GLOVE) ×6
GOWN STRL REUS W/TWL LRG LVL3 (GOWN DISPOSABLE) ×6 IMPLANT
PACK VAGINAL MINOR WOMEN LF (CUSTOM PROCEDURE TRAY) ×3 IMPLANT
PAD OB MATERNITY 4.3X12.25 (PERSONAL CARE ITEMS) ×3 IMPLANT
TOWEL OR 17X24 6PK STRL BLUE (TOWEL DISPOSABLE) ×6 IMPLANT
TUBING AQUILEX INFLOW (TUBING) ×3 IMPLANT
TUBING AQUILEX OUTFLOW (TUBING) ×3 IMPLANT
WATER STERILE IRR 1000ML POUR (IV SOLUTION) ×3 IMPLANT

## 2015-10-18 NOTE — Discharge Instructions (Signed)

## 2015-10-18 NOTE — Op Note (Signed)
10/18/2015  9:33 AM  PATIENT:  Teresa Lynch  45 y.o. female  PRE-OPERATIVE DIAGNOSIS:  Refractory Menorrhagia with Secondary Anemia  POST-OPERATIVE DIAGNOSIS:  Refractory Menorrhagia with Secondary Anemia  PROCEDURE:  Procedure(s): DILATATION & CURETTAGE/HYSTEROSCOPY WITH NOVASURE ENDOMETRIAL ABLATION  SURGEON:  Surgeon(s): Princess Bruins, MD  ASSISTANTS: none   ANESTHESIA:   general with Laryngeal Mask  PROCEDURE:  Under general anesthesia with laryngeal mask the patient is an lithotomy position. If she is prepped with Betadine on the suprapubic, vulvar and vaginal areas. She is draped as usual. The bladder was catheterized. The vaginal exam revealed an anteverted uterus normal volume no adnexal mass.  The speculum is inserted in the vagina and the anterior lip of the cervix is grasped with a tenaculum. A paracervical block is done with Nesacaine 1% a total of 25 cc at 4 and 8:00.  The hysterometry is at 10 cm with a cervical length at 4 cm for a cavity length of 6 cm. Dilation of the cervix with Hegar dilators up to #31 without difficulty.  The diagnostic hysteroscope is inserted in the intrauterine cavity.  Both ostia are well visualized and no intrauterine lesion is present.  The hysteroscope is therefore removed.  A systematic curettage of the intrauterine cavity is done with a sharp curet on all surfaces.The endometrial curettings are sent to pathology.  The NovaSure endometrial ablation system is inserted in the intrauterine cavity.  The width is measured at 4.3 cm.  The security test is passed successfully.  The endometrial ablation is done with a power of 142 W for 129 seconds.  The instrument is easily removed.  The tenaculum is removed from the cervix.  Silver nitrate is used to control hemostasis where the tenaculum was on the cervix.  The speculum is removed.  The patient is brought to recovery room in good and stable status.  ESTIMATED BLOOD LOSS: 25 cc   Intake/Output  Summary (Last 24 hours) at 10/18/15 0933 Last data filed at 10/18/15 [id number]  Gross per 24 hour  Intake    600 ml  Output     75 ml  Net    525 ml     BLOOD ADMINISTERED:none   LOCAL MEDICATIONS USED:  Nesacaine 1% 25 cc Paracervical block  SPECIMEN:  Source of Specimen:  Endometrial Curettings  DISPOSITION OF SPECIMEN:  PATHOLOGY  COUNTS:  YES  PLAN OF CARE: Transfer to PACU  Princess Bruins MD  10/18/2015 at 9:35 am

## 2015-10-18 NOTE — Anesthesia Preprocedure Evaluation (Addendum)
Anesthesia Evaluation  Patient identified by MRN, date of birth, ID band Patient awake    Reviewed: Allergy & Precautions, NPO status , Patient's Chart, lab work & pertinent test results  History of Anesthesia Complications Negative for: history of anesthetic complications  Airway Mallampati: II  TM Distance: >3 FB Neck ROM: Full    Dental  (+) Teeth Intact, Dental Advisory Given, Chipped,    Pulmonary neg pulmonary ROS,    Pulmonary exam normal breath sounds clear to auscultation       Cardiovascular Exercise Tolerance: Good negative cardio ROS Normal cardiovascular exam Rhythm:Regular Rate:Normal     Neuro/Psych  Headaches, negative psych ROS   GI/Hepatic negative GI ROS, Neg liver ROS,   Endo/Other  Obesity   Renal/GU negative Renal ROS  negative genitourinary   Musculoskeletal negative musculoskeletal ROS (+)   Abdominal   Peds  Hematology  (+) Blood dyscrasia, Sickle cell trait and anemia ,   Anesthesia Other Findings Day of surgery medications reviewed with the patient.  Menorrhagia   Reproductive/Obstetrics negative OB ROS                           Anesthesia Physical Anesthesia Plan  ASA: II  Anesthesia Plan: General   Post-op Pain Management:    Induction: Intravenous  Airway Management Planned: LMA  Additional Equipment:   Intra-op Plan:   Post-operative Plan: Extubation in OR  Informed Consent: I have reviewed the patients History and Physical, chart, labs and discussed the procedure including the risks, benefits and alternatives for the proposed anesthesia with the patient or authorized representative who has indicated his/her understanding and acceptance.   Dental advisory given  Plan Discussed with: CRNA  Anesthesia Plan Comments: (Risks/benefits of general anesthesia discussed with patient including risk of damage to teeth, lips, gum, and tongue,  nausea/vomiting, allergic reactions to medications, and the possibility of heart attack, stroke and death.  All patient questions answered.  Patient wishes to proceed.)        Anesthesia Quick Evaluation

## 2015-10-18 NOTE — Transfer of Care (Signed)
Immediate Anesthesia Transfer of Care Note  Patient: Teresa Lynch  Procedure(s) Performed: Procedure(s): DILATATION & CURETTAGE/HYSTEROSCOPY WITH NOVASURE ABLATION (N/A)  Patient Location: PACU  Anesthesia Type:General  Level of Consciousness: awake, alert  and oriented  Airway & Oxygen Therapy: Patient Spontanous Breathing and Patient connected to nasal cannula oxygen  Post-op Assessment: Report given to RN  Post vital signs: Reviewed  Last Vitals:  Filed Vitals:   10/18/15 0740  BP: 133/94  Pulse: 85  Temp: 36.7 C  Resp: 18    Complications: No apparent anesthesia complications

## 2015-10-18 NOTE — Anesthesia Postprocedure Evaluation (Signed)
Anesthesia Post Note  Patient: Teresa Lynch  Procedure(s) Performed: Procedure(s) (LRB): DILATATION & CURETTAGE/HYSTEROSCOPY WITH NOVASURE ABLATION (N/A)  Patient location during evaluation: PACU Anesthesia Type: General Level of consciousness: awake and alert Pain management: pain level controlled Vital Signs Assessment: post-procedure vital signs reviewed and stable Respiratory status: spontaneous breathing, nonlabored ventilation, respiratory function stable and patient connected to nasal cannula oxygen Cardiovascular status: blood pressure returned to baseline and stable Postop Assessment: no signs of nausea or vomiting Anesthetic complications: no    Last Vitals:  Filed Vitals:   10/18/15 1030 10/18/15 1045  BP: 118/76 123/79  Pulse: 69 81  Temp:    Resp: 24 19    Last Pain:  Filed Vitals:   10/18/15 1049  PainSc: 3                  Catalina Gravel

## 2015-10-18 NOTE — Anesthesia Procedure Notes (Signed)
Procedure Name: LMA Insertion Date/Time: 10/18/2015 9:05 AM Performed by: Talbot Grumbling Pre-anesthesia Checklist: Patient identified, Emergency Drugs available, Suction available and Patient being monitored Patient Re-evaluated:Patient Re-evaluated prior to inductionOxygen Delivery Method: Circle system utilized Preoxygenation: Pre-oxygenation with 100% oxygen Intubation Type: IV induction Ventilation: Mask ventilation without difficulty LMA: LMA inserted LMA Size: 4.0 Number of attempts: 1 Placement Confirmation: positive ETCO2 and breath sounds checked- equal and bilateral Tube secured with: Tape Dental Injury: Teeth and Oropharynx as per pre-operative assessment

## 2015-10-18 NOTE — H&P (Signed)
Teresa Lynch is an 45 y.o. female G74P5  RP:  Refractory Menorrhagia with anemia for [facility] D+C, Novasure Endometrial Ablation  Pertinent Gynecological History: Menses: Heavy and irregular Contraception: DepoProvera/Aviane Blood transfusions: None  Last mammogram: wnl Last pap: wnl  OB History:  G5P5   Menstrual History:  Patient's last menstrual period was 10/05/2015 (exact date).    Past Medical History  Diagnosis Date  . Headache     Migraines  . Anemia   . Heart murmur     only when pregnant in 2001  . Vaginal delivery 1991, 1993, 1994, 1998, 2001    Past Surgical History  Procedure Laterality Date  . Tubal ligation    . Wisdom tooth extraction      Family History  Problem Relation Age of Onset  . Cancer Mother     breast    Social History:  reports that she has never smoked. She has never used smokeless tobacco. She reports that she drinks alcohol. She reports that she does not use illicit drugs.  Allergies: No Known Allergies  Prescriptions prior to admission  Medication Sig Dispense Refill Last Dose  . fluconazole (DIFLUCAN) 150 MG tablet Take 1 tablet (150 mg total) by mouth once. Take 2nd pill if symptoms not completely resolved in 3 days. (Patient not taking: Reported on 10/06/2015) 2 tablet 0   . metroNIDAZOLE (FLAGYL) 500 MG tablet Take 1 tablet (500 mg total) by mouth 2 (two) times daily. (Patient not taking: Reported on 10/06/2015) 14 tablet 0   . ondansetron (ZOFRAN-ODT) 4 MG disintegrating tablet Take 1 tablet (4 mg total) by mouth every 8 (eight) hours as needed for nausea or vomiting. (Patient not taking: Reported on 10/06/2015) 20 tablet 0     ROS  Neg  Blood pressure 133/94, pulse 85, temperature 98 F (36.7 C), temperature source Oral, resp. rate 18, height 5\' 6"  (1.676 m), weight 193 lb (87.544 kg), last menstrual period 10/05/2015, SpO2 99 %. Physical Exam  Results for orders placed or performed during the hospital encounter of 10/18/15 (from  the past 24 hour(s))  CBC     Status: Abnormal   Collection Time: 10/18/15  7:30 AM  Result Value Ref Range   WBC 5.3 4.0 - 10.5 K/uL   RBC 3.76 (L) 3.87 - 5.11 MIL/uL   Hemoglobin 8.3 (L) 12.0 - 15.0 g/dL   HCT 27.6 (L) 36.0 - 46.0 %   MCV 73.4 (L) 78.0 - 100.0 fL   MCH 22.1 (L) 26.0 - 34.0 pg   MCHC 30.1 30.0 - 36.0 g/dL   RDW 17.9 (H) 11.5 - 15.5 %   Platelets 410 (H) 150 - 400 K/uL  Pregnancy, urine     Status: None   Collection Time: 10/18/15  7:31 AM  Result Value Ref Range   Preg Test, Ur NEGATIVE NEGATIVE    EBx Benign  Pelvic US Uterus and ovaries wnl.  Assessment/Plan: Menorrhagia with secondary anemia resistant to medical Rx for [facility] D+C, Endometrial Ablation.  Surgery and risks reviewed.  Monisha Siebel,MARIE-LYNE 10/18/2015, 8:35 AM

## 2015-10-18 NOTE — Discharge Summary (Signed)
  Physician Discharge Summary  Patient ID: Teresa Lynch MRN: [id number] DOB/AGE: 31-Jan-1971 45 y.o.  Admit date: 10/18/2015 Discharge date: 10/18/2015  Admission Diagnoses: Refractory Menorrhagia with Secondary Anemia  Discharge Diagnoses: Refractory Menorrhagia with Secondary Anemia        Active Problems:   * No active hospital problems. *   Discharged Condition: good  Hospital Course: Outpatient  Consults: None  Treatments: surgery: Hysteroscopy, D+C, Novasure Endometrial Ablation  Disposition: 01-Home or Self Care     Medication List    STOP taking these medications        fluconazole 150 MG tablet  Commonly known as:  DIFLUCAN     metroNIDAZOLE 500 MG tablet  Commonly known as:  FLAGYL     ondansetron 4 MG disintegrating tablet  Commonly known as:  ZOFRAN-ODT      TAKE these medications        oxyCODONE-acetaminophen 7.5-325 MG tablet  Commonly known as:  PERCOCET  Take 1 tablet by mouth every 4 (four) hours as needed for severe pain.           Follow-up Information    Follow up with Caedon Bond,MARIE-LYNE, MD In 3 weeks.   Specialty:  Obstetrics and Gynecology   Contact information:   [place] [address] [phone]       Signed: Princess Bruins, MD 10/18/2015, 9:38 AM

## 2015-10-19 ENCOUNTER — Encounter (HOSPITAL_COMMUNITY): Payer: Self-pay | Admitting: Obstetrics & Gynecology

## 2016-04-04 ENCOUNTER — Encounter: Payer: Managed Care, Other (non HMO) | Admitting: Neurology

## 2016-04-16 ENCOUNTER — Encounter: Payer: Self-pay | Admitting: Neurology

## 2016-04-16 ENCOUNTER — Ambulatory Visit (INDEPENDENT_AMBULATORY_CARE_PROVIDER_SITE_OTHER): Payer: Self-pay | Admitting: Neurology

## 2016-04-16 ENCOUNTER — Ambulatory Visit (INDEPENDENT_AMBULATORY_CARE_PROVIDER_SITE_OTHER): Payer: Managed Care, Other (non HMO) | Admitting: Neurology

## 2016-04-16 DIAGNOSIS — R202 Paresthesia of skin: Secondary | ICD-10-CM | POA: Diagnosis not present

## 2016-04-16 NOTE — Progress Notes (Signed)
Please refer to EMG and nerve conduction study procedure note. 

## 2016-04-16 NOTE — Procedures (Signed)
     HISTORY:  Teresa Lynch is a 45 year old patient with a history of bilateral hand numbness, left greater than right, over the last 2 years that has gradually worsened. The patient denies any neck pain or pain radiating down into the arms. The patient is being evaluated for possible neuropathy.  NERVE CONDUCTION STUDIES:  Nerve conduction studies were performed on both upper extremities. The distal motor latencies and motor amplitudes for the median and ulnar nerves were within normal limits. The F wave latencies and nerve conduction velocities for these nerves were also normal. The sensory latencies for the median and ulnar nerves were normal.   EMG STUDIES:  EMG study was performed on the left upper extremity:  The first dorsal interosseous muscle reveals 2 to 4 K units with full recruitment. No fibrillations or positive waves were noted. The abductor pollicis brevis muscle reveals 2 to 4 K units with full recruitment. No fibrillations or positive waves were noted. The extensor indicis proprius muscle reveals 1 to 3 K units with full recruitment. No fibrillations or positive waves were noted. The pronator teres muscle reveals 2 to 3 K units with full recruitment. No fibrillations or positive waves were noted. The biceps muscle reveals 1 to 2 K units with full recruitment. No fibrillations or positive waves were noted. The triceps muscle reveals 2 to 4 K units with full recruitment. No fibrillations or positive waves were noted. The anterior deltoid muscle reveals 2 to 3 K units with full recruitment. No fibrillations or positive waves were noted. The cervical paraspinal muscles were tested at 2 levels. No abnormalities of insertional activity were seen at either level tested. There was good relaxation.  EMG study was performed on the right upper extremity:  The first dorsal interosseous muscle reveals 2 to 4 K units with full recruitment. No fibrillations or positive waves were  noted. The abductor pollicis brevis muscle reveals 2 to 4 K units with full recruitment. No fibrillations or positive waves were noted. The extensor indicis proprius muscle reveals 1 to 3 K units with full recruitment. No fibrillations or positive waves were noted. The pronator teres muscle reveals 2 to 3 K units with full recruitment. No fibrillations or positive waves were noted. The biceps muscle reveals 1 to 2 K units with full recruitment. No fibrillations or positive waves were noted. The triceps muscle reveals 2 to 4 K units with full recruitment. No fibrillations or positive waves were noted. The anterior deltoid muscle reveals 2 to 3 K units with full recruitment. No fibrillations or positive waves were noted. The cervical paraspinal muscles were tested at 2 levels. No abnormalities of insertional activity were seen at either level tested. There was good relaxation.   IMPRESSION:  Nerve conduction studies done on both upper extremities were within normal limits. No evidence of carpal tunnel syndrome is seen on either side. EMG evaluation of both upper extremities were unremarkable, no evidence of a cervical radiculopathy is seen on either side.  Jill Alexanders MD 04/16/2016 5:20 PM  [facility] [address] [place], [place] [address]  Phone [phone] Fax [phone]

## 2016-05-02 ENCOUNTER — Telehealth: Payer: Self-pay | Admitting: Hematology

## 2016-05-02 ENCOUNTER — Encounter: Payer: Self-pay | Admitting: Hematology

## 2016-05-02 NOTE — Telephone Encounter (Signed)
Pt confirmed appt, complete intake, mailed pt letter, faxed referring provider appt date/time  °

## 2016-05-23 ENCOUNTER — Ambulatory Visit (HOSPITAL_BASED_OUTPATIENT_CLINIC_OR_DEPARTMENT_OTHER): Payer: Managed Care, Other (non HMO) | Admitting: Hematology

## 2016-05-23 ENCOUNTER — Telehealth: Payer: Self-pay | Admitting: Hematology

## 2016-05-23 ENCOUNTER — Ambulatory Visit (HOSPITAL_BASED_OUTPATIENT_CLINIC_OR_DEPARTMENT_OTHER): Payer: Managed Care, Other (non HMO)

## 2016-05-23 ENCOUNTER — Encounter: Payer: Self-pay | Admitting: Hematology

## 2016-05-23 VITALS — BP 121/76 | HR 83 | Temp 98.1°F | Resp 17 | Ht 66.0 in | Wt 203.4 lb

## 2016-05-23 DIAGNOSIS — Z803 Family history of malignant neoplasm of breast: Secondary | ICD-10-CM

## 2016-05-23 DIAGNOSIS — N920 Excessive and frequent menstruation with regular cycle: Secondary | ICD-10-CM | POA: Diagnosis not present

## 2016-05-23 DIAGNOSIS — D500 Iron deficiency anemia secondary to blood loss (chronic): Secondary | ICD-10-CM

## 2016-05-23 DIAGNOSIS — D509 Iron deficiency anemia, unspecified: Secondary | ICD-10-CM

## 2016-05-23 DIAGNOSIS — K649 Unspecified hemorrhoids: Secondary | ICD-10-CM

## 2016-05-23 LAB — COMPREHENSIVE METABOLIC PANEL
ALT: 13 U/L (ref 0–55)
AST: 16 U/L (ref 5–34)
Albumin: 3.6 g/dL (ref 3.5–5.0)
Alkaline Phosphatase: 90 U/L (ref 40–150)
Anion Gap: 11 mEq/L (ref 3–11)
BUN: 8.2 mg/dL (ref 7.0–26.0)
CALCIUM: 9.3 mg/dL (ref 8.4–10.4)
CHLORIDE: 107 meq/L (ref 98–109)
CO2: 24 meq/L (ref 22–29)
CREATININE: 0.7 mg/dL (ref 0.6–1.1)
EGFR: >90 mL/min/{1.73_m2} (ref >90)
Glucose: 83 mg/dl (ref 70–140)
POTASSIUM: 3.7 meq/L (ref 3.5–5.1)
SODIUM: 142 meq/L (ref 136–145)
Total Bilirubin: 0.89 mg/dL (ref 0.20–1.20)
Total Protein: 8.4 g/dL — ABNORMAL HIGH (ref 6.4–8.3)

## 2016-05-23 LAB — CBC & DIFF AND RETIC
BASO%: 0.4 % (ref 0.0–2.0)
BASOS ABS: 0 10*3/uL (ref 0.0–0.1)
EOS ABS: 0.1 10*3/uL (ref 0.0–0.5)
EOS%: 1.9 % (ref 0.0–7.0)
HCT: 32.7 % — ABNORMAL LOW (ref 34.8–46.6)
HGB: 10.4 g/dL — ABNORMAL LOW (ref 11.6–15.9)
Immature Retic Fract: 8.4 % (ref 1.60–10.00)
LYMPH%: 38.8 % (ref 14.0–49.7)
MCH: 25.4 pg (ref 25.1–34.0)
MCHC: 31.8 g/dL (ref 31.5–36.0)
MCV: 80 fL (ref 79.5–101.0)
MONO#: 0.3 10*3/uL (ref 0.1–0.9)
MONO%: 6.2 % (ref 0.0–14.0)
NEUT%: 52.7 % (ref 38.4–76.8)
NEUTROS ABS: 2.8 10*3/uL (ref 1.5–6.5)
Platelets: 365 10*3/uL (ref 145–400)
RBC: 4.09 10*6/uL (ref 3.70–5.45)
RDW: 17.5 % — AB (ref 11.2–14.5)
RETIC %: 0.86 % (ref 0.70–2.10)
RETIC CT ABS: 35.17 10*3/uL (ref 33.70–90.70)
WBC: 5.3 10*3/uL (ref 3.9–10.3)
lymph#: 2.1 10*3/uL (ref 0.9–3.3)

## 2016-05-23 LAB — IRON AND TIBC
%SAT: 12 % — AB (ref 21–57)
Iron: 55 ug/dL (ref 41–142)
TIBC: 453 ug/dL — ABNORMAL HIGH (ref 236–444)
UIBC: 398 ug/dL — AB (ref 120–384)

## 2016-05-23 LAB — FERRITIN: FERRITIN: 7 ng/mL — AB (ref 9–269)

## 2016-05-23 NOTE — Telephone Encounter (Signed)
GAVE PATIENT AVS REPORT AND APPOINTMENTS FOR October AND November  °

## 2016-05-23 NOTE — Progress Notes (Signed)
Marland Kitchen    HEMATOLOGY/ONCOLOGY CONSULTATION NOTE  Date of Service: 05/23/2016  Patient Care Team: Iona Beard, MD as PCP - General (Family Medicine) Gyn: Dr Dellis Filbert MD  CHIEF COMPLAINTS/PURPOSE OF CONSULTATION:  Anemia Strong family history breast cancer.  HISTORY OF PRESENTING ILLNESS:   Teresa Lynch is a wonderful 45 y.o. female who has been referred to [place] by Dr .Maggie Font, MD [facility]) for evaluation and management of Iron deficiency Anemia.  History of migraine headaches, possible irritable bowel syndrome, hypertension and iron deficiency anemia ("forever" per patient). Patient has had significant fatigue and some lightheadedness or headaches and lethargy and pica symptoms characterized by craving for ice starch and detergent for several years. She notes that she has had very heavy periods the last 8 years or so lasting 2-3 weeks. She has been seen by Dr. Dellis Filbert on GYN and has tried OCP, Depo-Provera and eventually had a D&C and endometrial ablation in February 2017. Patient notes that her periods have slowed down or several months but have started becoming heavy again the last 1-2 months. She has a follow-up with her gynecologist to evaluate this.  She also notes some rectal bleeding related to hemorrhoids on and off in the last several months. Over the last several years her hemoglobin has been in the 8-10 range and has never normalized. As being on different forms of oral iron on and off for years never has been able to correct her iron deficiency completely. She notes that she really wants to have her iron deficiency anemia completely corrected.  She also reports a very strong family history of breast cancer. She reports that her mother died of breast cancer at age 20 years. Her maternal aunt, maternal grandmother have also had breast cancer. A maternal cousin had breast cancer at age 30 years. She has never had genetic testing. She does not report knowledge of her affected  family members having a specific genetic mutation.  She reports her last mammogram was in February 2017 which she reports was negative for any breast lesions. She has 1 son and 1 daughter.   MEDICAL HISTORY:  Past Medical History:  Diagnosis Date  . Anemia   . Headache    Migraines  . Heart murmur    only when pregnant in 2001  . Vaginal delivery 1991, 1993, [place], 2001    SURGICAL HISTORY: Past Surgical History:  Procedure Laterality Date  . DILITATION & CURRETTAGE/HYSTROSCOPY WITH NOVASURE ABLATION N/A 10/18/2015   Procedure: DILATATION & CURETTAGE/HYSTEROSCOPY WITH NOVASURE ABLATION;  Surgeon: Princess Bruins, MD;  Location: [facility];  Service: Gynecology;  Laterality: N/A;  . TUBAL LIGATION    . WISDOM TOOTH EXTRACTION      SOCIAL HISTORY: Social History   Social History  . Marital status: Single    Spouse name: N/A  . Number of children: N/A  . Years of education: N/A   Occupational History  . Not on file.   Social History Main Topics  . Smoking status: Never Smoker  . Smokeless tobacco: Never Used  . Alcohol use Yes     Comment: social  . Drug use: No  . Sexual activity: Yes    Birth control/ protection: Injection, Surgical   Other Topics Concern  . Not on file   Social History Narrative  . No narrative on file    FAMILY HISTORY: Family History  Problem Relation Age of Onset  . Cancer Mother     breast    ALLERGIES:  has No Known Allergies.  MEDICATIONS:  Current Outpatient Prescriptions  Medication Sig Dispense Refill  . linaclotide (LINZESS) 145 MCG CAPS capsule Take 145 mcg by mouth daily before breakfast.    . valsartan-hydrochlorothiazide (DIOVAN-HCT) 160-12.5 MG tablet Take by mouth daily.     No current facility-administered medications for this visit.     REVIEW OF SYSTEMS:    10 Point review of Systems was done is negative except as noted above.  PHYSICAL EXAMINATION: ECOG PERFORMANCE STATUS: 1 - Symptomatic but completely  ambulatory  . Vitals:   05/23/16 1126  BP: 121/76  Pulse: 83  Resp: 17  Temp: 98.1 F (36.7 C)   Filed Weights   05/23/16 1126  Weight: 203 lb 6.4 oz (92.3 kg)   .Body mass index is 32.83 kg/m.  GENERAL:alert, in no acute distress and comfortable SKIN: skin color, texture, turgor are normal, no rashes or significant lesions EYES: normal, conjunctiva are pink and non-injected, sclera clear OROPHARYNX:no exudate, no erythema and lips, buccal mucosa, and tongue normal  NECK: supple, no JVD, thyroid normal size, non-tender, without nodularity LYMPH:  no palpable lymphadenopathy in the cervical, axillary or inguinal LUNGS: clear to auscultation with normal respiratory effort HEART: regular rate & rhythm,  no murmurs and no lower extremity edema ABDOMEN: abdomen soft, non-tender, normoactive bowel sounds  Musculoskeletal: no cyanosis of digits and no clubbing  PSYCH: alert & oriented x 3 with fluent speech NEURO: no focal motor/sensory deficits  LABORATORY DATA:  I have reviewed the data as listed  . CBC Latest Ref Rng & Units 05/23/2016 10/18/2015 02/14/2007  WBC 3.9 - 10.3 10e3/uL 5.3 5.3 -  Hemoglobin 11.6 - 15.9 g/dL 10.4(L) 8.3(L) 10.7(L)  Hematocrit 34.8 - 46.6 % 32.7(L) 27.6(L) 32.7(L)  Platelets 145 - 400 10e3/uL 365 410(H) -   .. CBC    Component Value Date/Time   WBC 5.3 05/23/2016 1256   WBC 5.3 10/18/2015 0730   RBC 4.09 05/23/2016 1256   RBC 3.76 (L) 10/18/2015 0730   HGB 10.4 (L) 05/23/2016 1256   HCT 32.7 (L) 05/23/2016 1256   PLT 365 05/23/2016 1256   MCV 80.0 05/23/2016 1256   MCH 25.4 05/23/2016 1256   MCH 22.1 (L) 10/18/2015 0730   MCHC 31.8 05/23/2016 1256   MCHC 30.1 10/18/2015 0730   RDW 17.5 (H) 05/23/2016 1256   LYMPHSABS 2.1 05/23/2016 1256   MONOABS 0.3 05/23/2016 1256   EOSABS 0.1 05/23/2016 1256   BASOSABS 0.0 05/23/2016 1256    Lab Results  Component Value Date   IRON 55 05/23/2016   TIBC 453 (H) 05/23/2016   IRONPCTSAT 12 (L)  05/23/2016   (Iron and TIBC)  Lab Results  Component Value Date   FERRITIN 7 (L) 05/23/2016      . CMP Latest Ref Rng & Units 05/23/2016  Glucose 70 - 140 mg/dl 83  BUN 7.0 - 26.0 mg/dL 8.2  Creatinine 0.6 - 1.1 mg/dL 0.7  Sodium 136 - 145 mEq/L 142  Potassium 3.5 - 5.1 mEq/L 3.7  CO2 22 - 29 mEq/L 24  Calcium 8.4 - 10.4 mg/dL 9.3  Total Protein 6.4 - 8.3 g/dL 8.4(H)  Total Bilirubin 0.20 - 1.20 mg/dL 0.89  Alkaline Phos 40 - 150 U/L 90  AST 5 - 34 U/L 16  ALT 0 - 55 U/L 13     RADIOGRAPHIC STUDIES: I have personally reviewed the radiological images as listed and agreed with the findings in the report. No results found.  ASSESSMENT &  PLAN:   45 year old African-American female with  1) Microcytic hypochromic anemia related to severe iron deficiency. 2) severe iron deficiency anemia related to heavy menstrual losses and some rectal bleeding due to hemorrhoids. 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 4) weakness fatigue and lethargy - likely related to iron deficiency anemia. 5) dysfunctional uterine bleeding - s/p trial with OCP, depo-provera - unsuccessful, had endometrial ablation in February 2017 which helped control her bleeding for a few months but she is back to having heavy periods at this time. PLAN -Given the patient's significant symptoms, her ongoing blood loss in the form of menorrhagia, significant iron deficiency and ill effects on her quality of life given the option of aggressive iron replacement and correction of anemia with IV iron. -Also present possibility she might need to consider hysterectomy from menorrhagia would mean she needs to have her anemia aggressively corrected. -We discussed the pros and cons of IV iron replacement. She decided to proceed and informed consent was obtained. -We shall treat her with IV Feraheme 510 mg every weekly x 3 doses of Benadryl premedication to reduce the risk of allergic  reactions. -We shall really assess her cbc, iron studies in 8 weeks after replacement of her iron IV. -She was recommended to continue follow-up with Dr. Dellis Filbert to help address her ongoing menorrhagia despite having had an endometrial ablation. -She will follow-up with her primary care physician to address any ongoing hemorrhoidal bleeding that might be a concern.  6) strong family history of breast cancer on her mother's side. Her mother had early breast cancer and died at age 47 years. Several other female members on her maternal side with breast cancer. No known genetic mutation. Plan -Would strongly favor patient have a genetic counselor evaluation and genetic testing. -She has been given a referral to see a genetic counselor and to consider screening for breast cancer syndromes.  Return to clinic with Dr. Irene Limbo in 8 weeks with CBC, CMP, ferritin, iron profile and results from genetic testing .  All of the patients questions were answered to her apparent satisfaction. The patient knows to call the clinic with any problems, questions or concerns.  I spent 50 minutes counseling the patient face to face. The total time spent in the appointment was 60 minutes and more than 50% was on counseling and direct patient cares.    Sullivan Lone MD [place] [facility] Hematology/Oncology Physician [facility]  (Office):       [phone] (Work cell):  [phone] (Fax):           [phone]  05/23/2016 12:07 PM

## 2016-05-29 ENCOUNTER — Other Ambulatory Visit: Payer: Managed Care, Other (non HMO)

## 2016-05-29 ENCOUNTER — Ambulatory Visit (HOSPITAL_BASED_OUTPATIENT_CLINIC_OR_DEPARTMENT_OTHER): Payer: Managed Care, Other (non HMO) | Admitting: Genetic Counselor

## 2016-05-29 DIAGNOSIS — Z803 Family history of malignant neoplasm of breast: Secondary | ICD-10-CM

## 2016-05-29 DIAGNOSIS — Z315 Encounter for genetic counseling: Secondary | ICD-10-CM

## 2016-05-29 DIAGNOSIS — Z809 Family history of malignant neoplasm, unspecified: Secondary | ICD-10-CM

## 2016-05-30 ENCOUNTER — Encounter: Payer: Self-pay | Admitting: Genetic Counselor

## 2016-05-30 ENCOUNTER — Ambulatory Visit (HOSPITAL_BASED_OUTPATIENT_CLINIC_OR_DEPARTMENT_OTHER): Payer: Managed Care, Other (non HMO)

## 2016-05-30 VITALS — BP 103/70 | HR 89 | Temp 97.9°F | Resp 18

## 2016-05-30 DIAGNOSIS — Z803 Family history of malignant neoplasm of breast: Secondary | ICD-10-CM | POA: Insufficient documentation

## 2016-05-30 DIAGNOSIS — D500 Iron deficiency anemia secondary to blood loss (chronic): Secondary | ICD-10-CM

## 2016-05-30 DIAGNOSIS — N920 Excessive and frequent menstruation with regular cycle: Secondary | ICD-10-CM | POA: Diagnosis not present

## 2016-05-30 DIAGNOSIS — D508 Other iron deficiency anemias: Secondary | ICD-10-CM

## 2016-05-30 MED ORDER — SODIUM CHLORIDE 0.9 % IV SOLN
Freq: Once | INTRAVENOUS | Status: AC
  Administered 2016-05-30: 09:00:00 via INTRAVENOUS

## 2016-05-30 MED ORDER — DIPHENHYDRAMINE HCL 25 MG PO TABS
25.0000 mg | ORAL_TABLET | Freq: Once | ORAL | Status: AC
  Administered 2016-05-30: 25 mg via ORAL
  Filled 2016-05-30: qty 1

## 2016-05-30 MED ORDER — FAMOTIDINE 20 MG PO TABS
ORAL_TABLET | ORAL | Status: AC
  Filled 2016-05-30: qty 1

## 2016-05-30 MED ORDER — DIPHENHYDRAMINE HCL 25 MG PO CAPS
ORAL_CAPSULE | ORAL | Status: AC
  Filled 2016-05-30: qty 1

## 2016-05-30 MED ORDER — FERUMOXYTOL INJECTION 510 MG/17 ML
510.0000 mg | Freq: Once | INTRAVENOUS | Status: AC
  Administered 2016-05-30: 510 mg via INTRAVENOUS
  Filled 2016-05-30: qty 17

## 2016-05-30 MED ORDER — FAMOTIDINE 20 MG PO TABS
20.0000 mg | ORAL_TABLET | Freq: Once | ORAL | Status: AC
  Administered 2016-05-30: 20 mg via ORAL

## 2016-05-30 NOTE — Patient Instructions (Signed)

## 2016-05-30 NOTE — Progress Notes (Signed)
REFERRING PROVIDER: Brunetta Genera, MD  PRIMARY PROVIDER:  Maggie Font, MD  PRIMARY REASON FOR VISIT:  1. Family history of breast cancer in female   2. Family history of cancer      HISTORY OF PRESENT ILLNESS:   Ms. Urick, a 45 y.o. female, was seen for a [place] cancer genetics consultation at the request of Dr. Irene Limbo due to a family history of early-onset breast cancer.  Ms. Poppen presents to clinic today to discuss the possibility of a hereditary predisposition to cancer, genetic testing, and to further clarify her future cancer risks, as well as potential cancer risks for family members.    Ms. Hearst is a 45 y.o. female with no personal history of cancer.    HORMONAL RISK FACTORS:  Menarche was at age 53.  First live birth at age 63.  OCP use for approximately 6 months.  Ovaries intact: yes.  Hysterectomy: no.  Menopausal status: premenopausal.  HRT use: 3 months. Colonoscopy: no; not examined. Mammogram within the last year: yes. Number of breast biopsies: 0. Up to date with pelvic exams:  yes. Any excessive radiation exposure/other exposures in the past:  Reports history of secondhand smoke exposure  Past Medical History:  Diagnosis Date  . Anemia   . Headache    Migraines  . Heart murmur    only when pregnant in 2001  . Vaginal delivery 1991, 1993, 1994, 1998, 2001    Past Surgical History:  Procedure Laterality Date  . DILITATION & CURRETTAGE/HYSTROSCOPY WITH NOVASURE ABLATION N/A 10/18/2015   Procedure: DILATATION & CURETTAGE/HYSTEROSCOPY WITH NOVASURE ABLATION;  Surgeon: Princess Bruins, MD;  Location: [facility];  Service: Gynecology;  Laterality: N/A;  . TUBAL LIGATION    . WISDOM TOOTH EXTRACTION      Social History   Social History  . Marital status: Single    Spouse name: N/A  . Number of children: N/A  . Years of education: N/A   Social History Main Topics  . Smoking status: Never Smoker  . Smokeless tobacco: Never Used  . Alcohol  use Yes     Comment: social  . Drug use: No  . Sexual activity: Yes    Birth control/ protection: Injection, Surgical   Other Topics Concern  . Not on file   Social History Narrative  . No narrative on file     FAMILY HISTORY:  We obtained a detailed, 4-generation family history.  Significant diagnoses are listed below: Family History  Problem Relation Age of Onset  . Breast cancer Mother     d. 48  . Sarcoidosis Maternal Aunt     d. 30s  . Breast cancer Maternal Aunt     dx 6s or younger  . Cirrhosis Maternal Uncle     +EtOH abuse; d. 57  . Breast cancer Paternal Aunt     dx under 59y; d. 40y  . Dementia Maternal Grandmother     d. 25  . Breast cancer Maternal Grandmother     dx under age of 52  . Heart attack Maternal Grandfather     d. 24s  . Down syndrome Sister     d. 41  . Cancer Maternal Uncle     d. 36s; unspecified type; smoker  . Breast cancer Cousin 25    maternal 1st cousin; d. 64  . Down syndrome Maternal Aunt     d. late 61s-30s    Ms. Hedtke has one son, age 22, and one daughter,  age 64.  Her son has three sons of his own.  Ms. Keniston has one full brother who passed away at 58 due to suffocation during a train car accident.  Ms. Luck has two maternal half-siblings--one brother and one sister, ages 8-49.  She also has four paternal half-siblings--one brother and three sisters, ages 53-45.  One of her paternal half-sisters had Down syndrome and passed away at 69.  Ms. Reuter reports no known history of cancer for any of her siblings or their children.    Ms. Strupp mother was diagnosed with breast cancer at an unspecified age and passed away at 83.  Ms. Hickson mother had three brothers and three sisters, and a few of these were half-siblings, but Ms. Rausch did not specify which siblings were full and which were half.  One brother died of alcohol abuse and cirrhosis at 22; another brother was a smoker and died of an unknown cancer in his 11s; the last  brother is currently in his 71s and has not had cancer.  One sister was diagnosed with breast cancer in her 46s or younger and she passed away from sarcoidosis in her 60s.  This sister had two sons and a daughter, and her daughter was diagnosed with and died of breast cancer at 75.  One sister had Down syndrome and passed away in her late 46s-30.  Another sister died during childbirth.  The last sister is currently in her early 64s and has never had cancer.  Ms. Kingsbury is unaware of any other first cousins who have had cancer.  Her maternal grandmother was diagnosed with breast cancer under the age of 96 and ultimately died of dementia-related causes at 67.  Ms. Sockwell maternal grandfather died of a heart attack at 58.  Ms. Piggee has no information for her maternal great aunts/uncles and great grandparents.    Ms. Raabe father is currently 32 and has never had cancer.  He has two full sisters and two full brothers.  One sister died of breast cancer at 76.  The other three siblings are ages 21-61 and have never had cancer.  Ms. Villacres reports no history of cancer for any of her paternal first cousins. Her paternal grandparents are in their early to [place] and have not had cancer.  Ms. Southgate reports a paternal family history of diabetes, but she has no further information for any of her paternal great aunts/uncles and great grandparents.  Ms. Sann reports no known family history of genetic testing for hereditary cancer risks.  Patient's maternal ancestors are of Native American descent, and paternal ancestors are of [place] American and Caucasian descent. There is no reported Ashkenazi Jewish ancestry. There is no known consanguinity.  GENETIC COUNSELING ASSESSMENT: ARDELLA CHHIM is a 45 y.o. female with a family history of early-onset breast cancer which is somewhat suggestive of a hereditary breast cancer syndrome and predisposition to cancer. We, therefore, discussed and recommended the following at  today's visit.   DISCUSSION: We reviewed the characteristics, features and inheritance patterns of hereditary cancer syndromes, particularly those caused by mutations within the BRCA1/2 genes. We also discussed genetic testing, including the appropriate family members to test, the process of testing, insurance coverage and turn-around-time for results. We discussed the implications of a negative, positive and/or variant of uncertain significant result. We recommended Ms. Owens Shark pursue genetic testing for the 20-gene Breast/Ovarian Cancer Panel through [facility].  The Breast/Ovarian Cancer Panel offered by [facility] Hope Pigeon, MD) includes sequencing and  deletion/duplication analysis for the following 19 genes:  ATM, BARD1, BRCA1, BRCA2, BRIP1, CDH1, CHEK2, FANCC, MLH1, MSH2, MSH6, NBN, PALB2, PMS2, PTEN, RAD51C, RAD51D, TP53, and XRCC2.  This panel also includes deletion/duplication analysis (without sequencing) for one gene, EPCAM.  Based on Ms. Danese's family history of cancer, she meets medical criteria for genetic testing. Despite that she meets criteria, she may still have an out of pocket cost. We discussed that if her out of pocket cost for testing is over $100, the laboratory will call and confirm whether she wants to proceed with testing.  If the out of pocket cost of testing is less than $100 she will be billed by the genetic testing laboratory.   Based on the patient's personal and family history, statistical models Baker Janus model and IBIS/Tyrer-Cuzick)  and literature data were used to estimate her risk of developing breast cancer. These estimate her lifetime risk of developing breast to be approximately 11.5% to 35.2%. This estimation does not take into account any genetic testing results.  The patient's lifetime breast cancer risk is a preliminary estimate based on available information using one of several models endorsed by the [place] (ACS). The ACS  recommends consideration of breast MRI screening as an adjunct to mammography for patients at high risk (defined as 20% or greater lifetime risk). A more detailed breast cancer risk assessment can be considered, if clinically indicated.   PLAN: After considering the risks, benefits, and limitations, Ms. Owens Shark  provided informed consent to pursue genetic testing and the blood sample was sent to [facility] for analysis of the 20-gene Breast/Ovarian Cancer Panel. Results should be available within approximately 2-3 weeks' time, at which point they will be disclosed by telephone to Ms. Mosher, as will any additional recommendations warranted by these results. Ms. Smigelski will receive a summary of her genetic counseling visit and a copy of her results once available. This information will also be available in Epic. We encouraged Ms. Doutt to remain in contact with cancer genetics annually so that we can continuously update the family history and inform her of any changes in cancer genetics and testing that may be of benefit for her family. Ms. Szymanowski questions were answered to her satisfaction today. Our contact information was provided should additional questions or concerns arise.  Thank you for the referral and allowing [place] to share in the care of your patient.   Jeanine Luz, MS, [facility] Certified Genetic Counselor [email or web address] Phone: [phone]  The patient was seen for a total of 60 minutes in face-to-face genetic counseling.  This patient was discussed with Drs. Magrinat, Lindi Adie and/or Burr Medico who agrees with the above.    _______________________________________________________________________ For Office Staff:  Number of people involved in session: 1 Was an Intern/ student involved with case: no

## 2016-06-06 ENCOUNTER — Ambulatory Visit (HOSPITAL_BASED_OUTPATIENT_CLINIC_OR_DEPARTMENT_OTHER): Payer: Managed Care, Other (non HMO)

## 2016-06-06 VITALS — BP 106/77 | HR 77 | Temp 97.9°F | Resp 16

## 2016-06-06 DIAGNOSIS — N920 Excessive and frequent menstruation with regular cycle: Secondary | ICD-10-CM

## 2016-06-06 DIAGNOSIS — D508 Other iron deficiency anemias: Secondary | ICD-10-CM | POA: Diagnosis not present

## 2016-06-06 DIAGNOSIS — K649 Unspecified hemorrhoids: Secondary | ICD-10-CM

## 2016-06-06 MED ORDER — FAMOTIDINE 20 MG PO TABS
20.0000 mg | ORAL_TABLET | Freq: Once | ORAL | Status: AC
  Administered 2016-06-06: 20 mg via ORAL

## 2016-06-06 MED ORDER — FAMOTIDINE 20 MG PO TABS
ORAL_TABLET | ORAL | Status: AC
  Filled 2016-06-06: qty 1

## 2016-06-06 MED ORDER — SODIUM CHLORIDE 0.9 % IV SOLN
Freq: Once | INTRAVENOUS | Status: AC
  Administered 2016-06-06: 09:00:00 via INTRAVENOUS

## 2016-06-06 MED ORDER — DIPHENHYDRAMINE HCL 25 MG PO TABS
25.0000 mg | ORAL_TABLET | Freq: Once | ORAL | Status: AC
  Administered 2016-06-06: 25 mg via ORAL
  Filled 2016-06-06: qty 1

## 2016-06-06 MED ORDER — SODIUM CHLORIDE 0.9 % IV SOLN
510.0000 mg | Freq: Once | INTRAVENOUS | Status: AC
  Administered 2016-06-06: 510 mg via INTRAVENOUS
  Filled 2016-06-06: qty 17

## 2016-06-06 MED ORDER — DIPHENHYDRAMINE HCL 25 MG PO CAPS
ORAL_CAPSULE | ORAL | Status: AC
  Filled 2016-06-06: qty 1

## 2016-06-06 NOTE — Patient Instructions (Signed)

## 2016-06-13 ENCOUNTER — Ambulatory Visit (HOSPITAL_BASED_OUTPATIENT_CLINIC_OR_DEPARTMENT_OTHER): Payer: Managed Care, Other (non HMO)

## 2016-06-13 VITALS — BP 105/69 | HR 76 | Temp 98.4°F | Resp 17

## 2016-06-13 DIAGNOSIS — N920 Excessive and frequent menstruation with regular cycle: Secondary | ICD-10-CM | POA: Diagnosis not present

## 2016-06-13 DIAGNOSIS — D508 Other iron deficiency anemias: Secondary | ICD-10-CM

## 2016-06-13 DIAGNOSIS — D500 Iron deficiency anemia secondary to blood loss (chronic): Secondary | ICD-10-CM | POA: Diagnosis not present

## 2016-06-13 MED ORDER — SODIUM CHLORIDE 0.9 % IV SOLN
Freq: Once | INTRAVENOUS | Status: AC
  Administered 2016-06-13: 09:00:00 via INTRAVENOUS

## 2016-06-13 MED ORDER — DIPHENHYDRAMINE HCL 25 MG PO CAPS
ORAL_CAPSULE | ORAL | Status: AC
  Filled 2016-06-13: qty 1

## 2016-06-13 MED ORDER — SODIUM CHLORIDE 0.9 % IV SOLN
510.0000 mg | Freq: Once | INTRAVENOUS | Status: AC
  Administered 2016-06-13: 510 mg via INTRAVENOUS
  Filled 2016-06-13: qty 17

## 2016-06-13 MED ORDER — DIPHENHYDRAMINE HCL 25 MG PO TABS
25.0000 mg | ORAL_TABLET | Freq: Once | ORAL | Status: AC
  Administered 2016-06-13: 25 mg via ORAL
  Filled 2016-06-13: qty 1

## 2016-06-13 MED ORDER — FAMOTIDINE 20 MG PO TABS
20.0000 mg | ORAL_TABLET | Freq: Once | ORAL | Status: AC
  Administered 2016-06-13: 20 mg via ORAL

## 2016-06-13 MED ORDER — FAMOTIDINE 20 MG PO TABS
ORAL_TABLET | ORAL | Status: AC
  Filled 2016-06-13: qty 1

## 2016-06-13 NOTE — Patient Instructions (Signed)

## 2016-07-13 ENCOUNTER — Telehealth: Payer: Self-pay | Admitting: Hematology

## 2016-07-13 NOTE — Telephone Encounter (Signed)
Lvm advising appt 11/24 moved to 11/29 @ 9.30am due to md pal.

## 2016-07-18 ENCOUNTER — Ambulatory Visit: Payer: Managed Care, Other (non HMO) | Admitting: Hematology

## 2016-07-18 ENCOUNTER — Other Ambulatory Visit: Payer: Managed Care, Other (non HMO)

## 2016-07-23 ENCOUNTER — Other Ambulatory Visit (HOSPITAL_BASED_OUTPATIENT_CLINIC_OR_DEPARTMENT_OTHER): Payer: Managed Care, Other (non HMO)

## 2016-07-23 ENCOUNTER — Telehealth: Payer: Self-pay | Admitting: Hematology

## 2016-07-23 ENCOUNTER — Encounter: Payer: Self-pay | Admitting: Hematology

## 2016-07-23 ENCOUNTER — Ambulatory Visit (HOSPITAL_BASED_OUTPATIENT_CLINIC_OR_DEPARTMENT_OTHER): Payer: Managed Care, Other (non HMO) | Admitting: Hematology

## 2016-07-23 VITALS — BP 121/78 | HR 79 | Temp 93.3°F | Resp 18 | Ht 66.0 in | Wt 205.2 lb

## 2016-07-23 DIAGNOSIS — N920 Excessive and frequent menstruation with regular cycle: Secondary | ICD-10-CM | POA: Diagnosis not present

## 2016-07-23 DIAGNOSIS — D500 Iron deficiency anemia secondary to blood loss (chronic): Secondary | ICD-10-CM

## 2016-07-23 DIAGNOSIS — Z803 Family history of malignant neoplasm of breast: Secondary | ICD-10-CM

## 2016-07-23 DIAGNOSIS — D509 Iron deficiency anemia, unspecified: Secondary | ICD-10-CM

## 2016-07-23 DIAGNOSIS — D508 Other iron deficiency anemias: Secondary | ICD-10-CM

## 2016-07-23 LAB — CBC & DIFF AND RETIC
BASO%: 0.3 % (ref 0.0–2.0)
BASOS ABS: 0 10*3/uL (ref 0.0–0.1)
EOS ABS: 0.1 10*3/uL (ref 0.0–0.5)
EOS%: 2.6 % (ref 0.0–7.0)
HCT: 34.3 % — ABNORMAL LOW (ref 34.8–46.6)
HEMOGLOBIN: 11.3 g/dL — AB (ref 11.6–15.9)
IMMATURE RETIC FRACT: 4.8 % (ref 1.60–10.00)
LYMPH%: 41.4 % (ref 14.0–49.7)
MCH: 29.4 pg (ref 25.1–34.0)
MCHC: 32.9 g/dL (ref 31.5–36.0)
MCV: 89.3 fL (ref 79.5–101.0)
MONO#: 0.4 10*3/uL (ref 0.1–0.9)
MONO%: 9.3 % (ref 0.0–14.0)
NEUT#: 1.8 10*3/uL (ref 1.5–6.5)
NEUT%: 46.4 % (ref 38.4–76.8)
PLATELETS: 287 10*3/uL (ref 145–400)
RBC: 3.84 10*6/uL (ref 3.70–5.45)
RDW: 18.1 % — ABNORMAL HIGH (ref 11.2–14.5)
Retic %: 1.58 % (ref 0.70–2.10)
Retic Ct Abs: 60.67 10*3/uL (ref 33.70–90.70)
WBC: 3.9 10*3/uL (ref 3.9–10.3)
lymph#: 1.6 10*3/uL (ref 0.9–3.3)
nRBC: 0 % (ref 0–0)

## 2016-07-23 LAB — IRON AND TIBC
%SAT: 32 % (ref 21–57)
Iron: 85 ug/dL (ref 41–142)
TIBC: 261 ug/dL (ref 236–444)
UIBC: 176 ug/dL (ref 120–384)

## 2016-07-23 LAB — BASIC METABOLIC PANEL
Anion Gap: 10 mEq/L (ref 3–11)
BUN: 10.9 mg/dL (ref 7.0–26.0)
CHLORIDE: 108 meq/L (ref 98–109)
CO2: 24 meq/L (ref 22–29)
CREATININE: 0.8 mg/dL (ref 0.6–1.1)
Calcium: 9.6 mg/dL (ref 8.4–10.4)
EGFR: >90 mL/min/{1.73_m2} (ref >90)
GLUCOSE: 100 mg/dL (ref 70–140)
Potassium: 3.7 mEq/L (ref 3.5–5.1)
Sodium: 142 mEq/L (ref 136–145)

## 2016-07-23 LAB — FERRITIN: Ferritin: 242 ng/ml (ref 9–269)

## 2016-07-23 NOTE — Telephone Encounter (Signed)
Gave patient avs report and appointments for March  °

## 2016-07-23 NOTE — Patient Instructions (Signed)
-  Start taking iron polysaccharide 150 mg by mouth twice a day with orange juice -May use over-the-counter stool softener for constipation -Continue follow-up with Dr. Dellis Filbert for management of menorrhagia. -We shall see you back in 4 months with repeat labs to monitor your iron deficiency anemia. -We shall follow up on your iron labs from today and call you to offer additional IV iron if your ferritin levels are less than 50.

## 2016-07-23 NOTE — Progress Notes (Signed)
Teresa Lynch Kitchen    HEMATOLOGY/ONCOLOGY CLINIC NOTE  Date of Service: 07/23/2016  Patient Care Team: Iona Beard, MD as PCP - General (Family Medicine) Gyn: Dr Dellis Filbert MD  CHIEF COMPLAINTS/PURPOSE OF CONSULTATION:  Anemia Strong family history breast cancer.  HISTORY OF PRESENTING ILLNESS:   Teresa Lynch is a wonderful 45 y.o. female who has been referred to [place] by Dr .Maggie Font, MD [facility]) for evaluation and management of Iron deficiency Anemia.  History of migraine headaches, possible irritable bowel syndrome, hypertension and iron deficiency anemia ("forever" per patient). Patient has had significant fatigue and some lightheadedness or headaches and lethargy and pica symptoms characterized by craving for ice starch and detergent for several years. She notes that she has had very heavy periods the last 8 years or so lasting 2-3 weeks. She has been seen by Dr. Dellis Filbert on GYN and has tried OCP, Depo-Provera and eventually had a D&C and endometrial ablation in February 2017. Patient notes that her periods have slowed down or several months but have started becoming heavy again the last 1-2 months. She has a follow-up with her gynecologist to evaluate this.  She also notes some rectal bleeding related to hemorrhoids on and off in the last several months. Over the last several years her hemoglobin has been in the 8-10 range and has never normalized. As being on different forms of oral iron on and off for years never has been able to correct her iron deficiency completely. She notes that she really wants to have her iron deficiency anemia completely corrected.  She also reports a very strong family history of breast cancer. She reports that her mother died of breast cancer at age 78 years. Her maternal aunt, maternal grandmother have also had breast cancer. A maternal cousin had breast cancer at age 30 years. She has never had genetic testing. She does not report knowledge of her affected  family members having a specific genetic mutation.  She reports her last mammogram was in February 2017 which she reports was negative for any breast lesions. She has 1 son and 1 daughter.  INTERVAL HISTORY  Patient is here for follow-up of her iron deficiency anemia. Her energy levels are improved some and her hemoglobin has improved from 8.3 to 11.3 with resolution of microcytosis. She is still having heavy periods lasting 7-8 days and has a follow-up with Dr. Dellis Filbert her GYN doctor for further management of her menorrhagia. We discussed starting oral iron polysaccharide 100 mg by mouth twice a day with orange juice. She notes that she is not needing her linzess for IBS-C and is not having much issue with constipation at this time.  No other evidence of overt bleeding. Eating a fairly balanced diet.  MEDICAL HISTORY:  Past Medical History:  Diagnosis Date  . Anemia   . Headache    Migraines  . Heart murmur    only when pregnant in 2001  . Vaginal delivery 1991, 1993, [place], 2001    SURGICAL HISTORY: Past Surgical History:  Procedure Laterality Date  . DILITATION & CURRETTAGE/HYSTROSCOPY WITH NOVASURE ABLATION N/A 10/18/2015   Procedure: DILATATION & CURETTAGE/HYSTEROSCOPY WITH NOVASURE ABLATION;  Surgeon: Princess Bruins, MD;  Location: [facility];  Service: Gynecology;  Laterality: N/A;  . TUBAL LIGATION    . WISDOM TOOTH EXTRACTION      SOCIAL HISTORY: Social History   Social History  . Marital status: Single    Spouse name: N/A  . Number of children: N/A  .  Years of education: N/A   Occupational History  . Not on file.   Social History Main Topics  . Smoking status: Never Smoker  . Smokeless tobacco: Never Used  . Alcohol use Yes     Comment: social  . Drug use: No  . Sexual activity: Yes    Birth control/ protection: Injection, Surgical   Other Topics Concern  . Not on file   Social History Narrative  . No narrative on file    FAMILY HISTORY: Family  History  Problem Relation Age of Onset  . Breast cancer Mother     d. 28  . Sarcoidosis Maternal Aunt     d. 4s  . Breast cancer Maternal Aunt     dx 40s or younger  . Cirrhosis Maternal Uncle     +EtOH abuse; d. 51  . Breast cancer Paternal Aunt     dx under 67y; d. 107y  . Dementia Maternal Grandmother     d. 50  . Breast cancer Maternal Grandmother     dx under age of 38  . Heart attack Maternal Grandfather     d. 20s  . Down syndrome Sister     d. 27  . Cancer Maternal Uncle     d. 53s; unspecified type; smoker  . Breast cancer Cousin 25    maternal 1st cousin; d. 66  . Down syndrome Maternal Aunt     d. late 20s-30s    ALLERGIES:  has No Known Allergies.  MEDICATIONS:  Current Outpatient Prescriptions  Medication Sig Dispense Refill  . linaclotide (LINZESS) 145 MCG CAPS capsule Take 145 mcg by mouth daily before breakfast.    . valsartan-hydrochlorothiazide (DIOVAN-HCT) 160-12.5 MG tablet Take by mouth daily.     No current facility-administered medications for this visit.     REVIEW OF SYSTEMS:    10 Point review of Systems was done is negative except as noted above.  PHYSICAL EXAMINATION: ECOG PERFORMANCE STATUS: 1 - Symptomatic but completely ambulatory  . There were no vitals filed for this visit. There were no vitals filed for this visit. .There is no height or weight on file to calculate BMI.  GENERAL:alert, in no acute distress and comfortable SKIN: skin color, texture, turgor are normal, no rashes or significant lesions EYES: normal, conjunctiva are pink and non-injected, sclera clear OROPHARYNX:no exudate, no erythema and lips, buccal mucosa, and tongue normal  NECK: supple, no JVD, thyroid normal size, non-tender, without nodularity LYMPH:  no palpable lymphadenopathy in the cervical, axillary or inguinal LUNGS: clear to auscultation with normal respiratory effort HEART: regular rate & rhythm,  no murmurs and no lower extremity edema ABDOMEN:  abdomen soft, non-tender, normoactive bowel sounds  Musculoskeletal: no cyanosis of digits and no clubbing  PSYCH: alert & oriented x 3 with fluent speech NEURO: no focal motor/sensory deficits  LABORATORY DATA:  I have reviewed the data as listed  . CBC Latest Ref Rng & Units 07/23/2016 05/23/2016 10/18/2015  WBC 3.9 - 10.3 10e3/uL 3.9 5.3 5.3  Hemoglobin 11.6 - 15.9 g/dL 11.3(L) 10.4(L) 8.3(L)  Hematocrit 34.8 - 46.6 % 34.3(L) 32.7(L) 27.6(L)  Platelets 145 - 400 10e3/uL 287 365 410(H)   .Teresa Lynch Kitchen CBC    Component Value Date/Time   WBC 3.9 07/23/2016 0940   WBC 5.3 10/18/2015 0730   RBC 3.84 07/23/2016 0940   RBC 3.76 (L) 10/18/2015 0730   HGB 11.3 (L) 07/23/2016 0940   HCT 34.3 (L) 07/23/2016 0940   PLT 287 07/23/2016  0940   MCV 89.3 07/23/2016 0940   MCH 29.4 07/23/2016 0940   MCH 22.1 (L) 10/18/2015 0730   MCHC 32.9 07/23/2016 0940   MCHC 30.1 10/18/2015 0730   RDW 18.1 (H) 07/23/2016 0940   LYMPHSABS 1.6 07/23/2016 0940   MONOABS 0.4 07/23/2016 0940   EOSABS 0.1 07/23/2016 0940   BASOSABS 0.0 07/23/2016 0940    Lab Results  Component Value Date   IRON 55 05/23/2016   TIBC 453 (H) 05/23/2016   IRONPCTSAT 12 (L) 05/23/2016   (Iron and TIBC)  Lab Results  Component Value Date   FERRITIN 7 (L) 05/23/2016      . CMP Latest Ref Rng & Units 05/23/2016  Glucose 70 - 140 mg/dl 83  BUN 7.0 - 26.0 mg/dL 8.2  Creatinine 0.6 - 1.1 mg/dL 0.7  Sodium 136 - 145 mEq/L 142  Potassium 3.5 - 5.1 mEq/L 3.7  CO2 22 - 29 mEq/L 24  Calcium 8.4 - 10.4 mg/dL 9.3  Total Protein 6.4 - 8.3 g/dL 8.4(H)  Total Bilirubin 0.20 - 1.20 mg/dL 0.89  Alkaline Phos 40 - 150 U/L 90  AST 5 - 34 U/L 16  ALT 0 - 55 U/L 13     RADIOGRAPHIC STUDIES: I have personally reviewed the radiological images as listed and agreed with the findings in the report. No results found.  ASSESSMENT & PLAN:   45 year old African-American female with  1) Microcytic hypochromic anemia related to severe iron  deficiency. Anemia much improved hemoglobin is up from 8.3 now at 11.3. 2) severe iron deficiency anemia related to heavy menstrual losses and some rectal bleeding due to hemorrhoids. 3) PICA symptoms with craving for ice starch and detergent due to iron deficiency- resolved 4) weakness fatigue and lethargy - likely related to iron deficiency anemia. Improved 5) dysfunctional uterine bleeding - s/p trial with OCP, depo-provera - unsuccessful, had endometrial ablation in February 2017 which helped control her bleeding for a few months patient notes she is continuing to have heavy periods at this time.  PLAN -Patient was started on iron polysaccharide 150 mg by mouth twice a day with orange juice to help with iron replacement maintenance in the setting of ongoing menorrhagia and fatigue. -She could also take an over-the-counter multivitamin. -She was recommended to use over-the-counter stool softener as needed for constipation. -She reports that she has not needed to use her Linzess for IBS since her constipation has resolved. -We are waiting her ferritin and iron profile from today and she'll offer her additional IV iron if her ferritin level is less than 50. -She was recommended to continue follow-up with Dr. Dellis Filbert to help address her ongoing menorrhagia despite having had an endometrial ablation. -She will follow-up with her primary care physician to address any ongoing hemorrhoidal bleeding that might be a concern.  6) strong family history of breast cancer on her mother's side. Her mother had early breast cancer and died at age 86 years. Several other female members on her maternal side with breast cancer. No known genetic mutation. Plan -Patient has been seen by our genetic counselor Dr. Jeanine Luz and genetic testing profile has been sent out. Results are pending. Patient has a follow-up appointment in December.  Return to clinic with Dr. Irene Limbo in 4 months with CBC, CMP, ferritin, iron  profile and results from genetic testing .  All of the patients questions were answered to her apparent satisfaction. The patient knows to call the clinic with any problems, questions or concerns.  I spent 20 minutes counseling the patient face to face. The total time spent in the appointment was 25 minutes and more than 50% was on counseling and direct patient cares.    Sullivan Lone MD [place] AAHIVMS [facility] Hematology/Oncology Physician [facility]  (Office):       [phone] (Work cell):  [phone] (Fax):           [phone]  07/23/2016 10:01 AM

## 2016-11-17 ENCOUNTER — Ambulatory Visit (HOSPITAL_BASED_OUTPATIENT_CLINIC_OR_DEPARTMENT_OTHER): Payer: Managed Care, Other (non HMO) | Admitting: Hematology

## 2016-11-17 ENCOUNTER — Other Ambulatory Visit (HOSPITAL_BASED_OUTPATIENT_CLINIC_OR_DEPARTMENT_OTHER): Payer: Managed Care, Other (non HMO)

## 2016-11-17 VITALS — BP 126/85 | HR 76 | Temp 97.7°F | Resp 18 | Ht 66.0 in | Wt 201.9 lb

## 2016-11-17 DIAGNOSIS — Z803 Family history of malignant neoplasm of breast: Secondary | ICD-10-CM

## 2016-11-17 DIAGNOSIS — D508 Other iron deficiency anemias: Secondary | ICD-10-CM

## 2016-11-17 DIAGNOSIS — N920 Excessive and frequent menstruation with regular cycle: Secondary | ICD-10-CM | POA: Diagnosis not present

## 2016-11-17 DIAGNOSIS — D500 Iron deficiency anemia secondary to blood loss (chronic): Secondary | ICD-10-CM

## 2016-11-17 DIAGNOSIS — D509 Iron deficiency anemia, unspecified: Secondary | ICD-10-CM | POA: Diagnosis not present

## 2016-11-17 DIAGNOSIS — Z809 Family history of malignant neoplasm, unspecified: Secondary | ICD-10-CM

## 2016-11-17 LAB — CBC & DIFF AND RETIC
BASO%: 0.2 % (ref 0.0–2.0)
BASOS ABS: 0 10*3/uL (ref 0.0–0.1)
EOS%: 2.6 % (ref 0.0–7.0)
Eosinophils Absolute: 0.1 10*3/uL (ref 0.0–0.5)
HCT: 38.5 % (ref 34.8–46.6)
HGB: 12.9 g/dL (ref 11.6–15.9)
IMMATURE RETIC FRACT: 4.5 % (ref 1.60–10.00)
LYMPH#: 1.9 10*3/uL (ref 0.9–3.3)
LYMPH%: 40.6 % (ref 14.0–49.7)
MCH: 31.2 pg (ref 25.1–34.0)
MCHC: 33.5 g/dL (ref 31.5–36.0)
MCV: 93.2 fL (ref 79.5–101.0)
MONO#: 0.4 10*3/uL (ref 0.1–0.9)
MONO%: 8.4 % (ref 0.0–14.0)
NEUT#: 2.2 10*3/uL (ref 1.5–6.5)
NEUT%: 48.2 % (ref 38.4–76.8)
PLATELETS: 281 10*3/uL (ref 145–400)
RBC: 4.13 10*6/uL (ref 3.70–5.45)
RDW: 12.6 % (ref 11.2–14.5)
RETIC CT ABS: 55.76 10*3/uL (ref 33.70–90.70)
Retic %: 1.35 % (ref 0.70–2.10)
WBC: 4.6 10*3/uL (ref 3.9–10.3)

## 2016-11-17 LAB — COMPREHENSIVE METABOLIC PANEL
ALBUMIN: 3.9 g/dL (ref 3.5–5.0)
ALT: 16 U/L (ref 0–55)
AST: 15 U/L (ref 5–34)
Alkaline Phosphatase: 85 U/L (ref 40–150)
Anion Gap: 10 mEq/L (ref 3–11)
BILIRUBIN TOTAL: 0.7 mg/dL (ref 0.20–1.20)
BUN: 8.2 mg/dL (ref 7.0–26.0)
CO2: 28 mEq/L (ref 22–29)
CREATININE: 0.8 mg/dL (ref 0.6–1.1)
Calcium: 10 mg/dL (ref 8.4–10.4)
Chloride: 103 mEq/L (ref 98–109)
GLUCOSE: 85 mg/dL (ref 70–140)
Potassium: 4.4 mEq/L (ref 3.5–5.1)
SODIUM: 141 meq/L (ref 136–145)
TOTAL PROTEIN: 8.1 g/dL (ref 6.4–8.3)

## 2016-11-17 LAB — IRON AND TIBC
%SAT: 32 % (ref 21–57)
Iron: 83 ug/dL (ref 41–142)
TIBC: 263 ug/dL (ref 236–444)
UIBC: 180 ug/dL (ref 120–384)

## 2016-11-17 LAB — FERRITIN: Ferritin: 131 ng/ml (ref 9–269)

## 2016-11-20 NOTE — Progress Notes (Signed)
Marland Kitchen    HEMATOLOGY/ONCOLOGY CLINIC NOTE  Date of Service: .11/17/2016  Patient Care Team: Iona Beard, MD as PCP - General (Family Medicine) Gyn: Dr Dellis Filbert MD  CHIEF COMPLAINTS/PURPOSE OF CONSULTATION:  Anemia Strong family history breast cancer.  HISTORY OF PRESENTING ILLNESS:   Teresa Lynch is a wonderful 46 y.o. female who has been referred to [place] by Dr .Maggie Font, MD [facility]) for evaluation and management of Iron deficiency Anemia.  History of migraine headaches, possible irritable bowel syndrome, hypertension and iron deficiency anemia ("forever" per patient). Patient has had significant fatigue and some lightheadedness or headaches and lethargy and pica symptoms characterized by craving for ice starch and detergent for several years. She notes that she has had very heavy periods the last 8 years or so lasting 2-3 weeks. She has been seen by Dr. Dellis Filbert on GYN and has tried OCP, Depo-Provera and eventually had a D&C and endometrial ablation in February 2017. Patient notes that her periods have slowed down or several months but have started becoming heavy again the last 1-2 months. She has a follow-up with her gynecologist to evaluate this.  She also notes some rectal bleeding related to hemorrhoids on and off in the last several months. Over the last several years her hemoglobin has been in the 8-10 range and has never normalized. As being on different forms of oral iron on and off for years never has been able to correct her iron deficiency completely. She notes that she really wants to have her iron deficiency anemia completely corrected.  She also reports a very strong family history of breast cancer. She reports that her mother died of breast cancer at age 43 years. Her maternal aunt, maternal grandmother have also had breast cancer. A maternal cousin had breast cancer at age 88 years. She has never had genetic testing. She does not report knowledge of her affected  family members having a specific genetic mutation.  She reports her last mammogram was in February 2017 which she reports was negative for any breast lesions. She has 1 son and 1 daughter.  INTERVAL HISTORY  Patient is here for follow-up of her iron deficiency anemia. Her energy levels are good and her hemoglobin is up to 12.9 with a ferritin of 131.  She has not been taking oral iron as we had discussed . Was supposed to be on oral iron polysaccharide 150 mg by mouth twice a day with orange juice. She notes that she is not needing her linzess for IBS-C and is not having much issue with constipation at this time.  No other evidence of overt bleeding. Eating a fairly balanced diet. She notes that her periods are still heavy and she is trying to decide if she will be considering a hysterectomy or not. She was recommended to continue follow-up with Dr. Dellis Filbert.  MEDICAL HISTORY:  Past Medical History:  Diagnosis Date  . Anemia   . Headache    Migraines  . Heart murmur    only when pregnant in 2001  . Vaginal delivery 1991, 1993, [place], 2001    SURGICAL HISTORY: Past Surgical History:  Procedure Laterality Date  . DILITATION & CURRETTAGE/HYSTROSCOPY WITH NOVASURE ABLATION N/A 10/18/2015   Procedure: DILATATION & CURETTAGE/HYSTEROSCOPY WITH NOVASURE ABLATION;  Surgeon: Princess Bruins, MD;  Location: [facility];  Service: Gynecology;  Laterality: N/A;  . TUBAL LIGATION    . WISDOM TOOTH EXTRACTION      SOCIAL HISTORY: Social History   Social  History  . Marital status: Single    Spouse name: N/A  . Number of children: N/A  . Years of education: N/A   Occupational History  . Not on file.   Social History Main Topics  . Smoking status: Never Smoker  . Smokeless tobacco: Never Used  . Alcohol use Yes     Comment: social  . Drug use: No  . Sexual activity: Yes    Birth control/ protection: Injection, Surgical   Other Topics Concern  . Not on file   Social History  Narrative  . No narrative on file    FAMILY HISTORY: Family History  Problem Relation Age of Onset  . Breast cancer Mother     d. 50  . Sarcoidosis Maternal Aunt     d. 75s  . Breast cancer Maternal Aunt     dx 67s or younger  . Cirrhosis Maternal Uncle     +EtOH abuse; d. 26  . Breast cancer Paternal Aunt     dx under 48y; d. 8y  . Dementia Maternal Grandmother     d. 7  . Breast cancer Maternal Grandmother     dx under age of 49  . Heart attack Maternal Grandfather     d. 20s  . Down syndrome Sister     d. 63  . Cancer Maternal Uncle     d. 18s; unspecified type; smoker  . Breast cancer Cousin 25    maternal 1st cousin; d. 51  . Down syndrome Maternal Aunt     d. late 20s-30s    ALLERGIES:  has No Known Allergies.  MEDICATIONS:  Current Outpatient Prescriptions  Medication Sig Dispense Refill  . linaclotide (LINZESS) 145 MCG CAPS capsule Take 145 mcg by mouth daily before breakfast.    . valsartan-hydrochlorothiazide (DIOVAN-HCT) 160-12.5 MG tablet Take by mouth daily.     No current facility-administered medications for this visit.     REVIEW OF SYSTEMS:    10 Point review of Systems was done is negative except as noted above.  PHYSICAL EXAMINATION: ECOG PERFORMANCE STATUS: 1 - Symptomatic but completely ambulatory  . Vitals:   11/17/16 1248  BP: 126/85  Pulse: 76  Resp: 18  Temp: 97.7 F (36.5 C)   Filed Weights   11/17/16 1248  Weight: 201 lb 14.4 oz (91.6 kg)   .Body mass index is 32.59 kg/m.  GENERAL:alert, in no acute distress and comfortable SKIN: skin color, texture, turgor are normal, no rashes or significant lesions EYES: normal, conjunctiva are pink and non-injected, sclera clear OROPHARYNX:no exudate, no erythema and lips, buccal mucosa, and tongue normal  NECK: supple, no JVD, thyroid normal size, non-tender, without nodularity LYMPH:  no palpable lymphadenopathy in the cervical, axillary or inguinal LUNGS: clear to  auscultation with normal respiratory effort HEART: regular rate & rhythm,  no murmurs and no lower extremity edema ABDOMEN: abdomen soft, non-tender, normoactive bowel sounds  Musculoskeletal: no cyanosis of digits and no clubbing  PSYCH: alert & oriented x 3 with fluent speech NEURO: no focal motor/sensory deficits  LABORATORY DATA:  I have reviewed the data as listed  . CBC Latest Ref Rng & Units 11/17/2016 07/23/2016 05/23/2016  WBC 3.9 - 10.3 10e3/uL 4.6 3.9 5.3  Hemoglobin 11.6 - 15.9 g/dL 12.9 11.3(L) 10.4(L)  Hematocrit 34.8 - 46.6 % 38.5 34.3(L) 32.7(L)  Platelets 145 - 400 10e3/uL 281 287 365   .Marland Kitchen CBC    Component Value Date/Time   WBC 4.6 11/17/2016 1239  WBC 5.3 10/18/2015 0730   RBC 4.13 11/17/2016 1239   RBC 3.76 (L) 10/18/2015 0730   HGB 12.9 11/17/2016 1239   HCT 38.5 11/17/2016 1239   PLT 281 11/17/2016 1239   MCV 93.2 11/17/2016 1239   MCH 31.2 11/17/2016 1239   MCH 22.1 (L) 10/18/2015 0730   MCHC 33.5 11/17/2016 1239   MCHC 30.1 10/18/2015 0730   RDW 12.6 11/17/2016 1239   LYMPHSABS 1.9 11/17/2016 1239   MONOABS 0.4 11/17/2016 1239   EOSABS 0.1 11/17/2016 1239   BASOSABS 0.0 11/17/2016 1239    Lab Results  Component Value Date   IRON 83 11/17/2016   TIBC 263 11/17/2016   IRONPCTSAT 32 11/17/2016   (Iron and TIBC)  Lab Results  Component Value Date   FERRITIN 131 11/17/2016      . CMP Latest Ref Rng & Units 11/17/2016 07/23/2016 05/23/2016  Glucose 70 - 140 mg/dl 85 100 83  BUN 7.0 - 26.0 mg/dL 8.2 10.9 8.2  Creatinine 0.6 - 1.1 mg/dL 0.8 0.8 0.7  Sodium 136 - 145 mEq/L 141 142 142  Potassium 3.5 - 5.1 mEq/L 4.4 3.7 3.7  CO2 22 - 29 mEq/L 28 24 24   Calcium 8.4 - 10.4 mg/dL 10.0 9.6 9.3  Total Protein 6.4 - 8.3 g/dL 8.1 - 8.4(H)  Total Bilirubin 0.20 - 1.20 mg/dL 0.70 - 0.89  Alkaline Phos 40 - 150 U/L 85 - 90  AST 5 - 34 U/L 15 - 16  ALT 0 - 55 U/L 16 - 13     RADIOGRAPHIC STUDIES: I have personally reviewed the radiological  images as listed and agreed with the findings in the report. No results found.  ASSESSMENT & PLAN:   46 year old African-American female with  1) Microcytic hypochromic anemia related to severe iron deficiency. Anemia much improved hemoglobin is up from 8.3 to 11.3 and now to 12.9. 2) severe iron deficiency anemia related to heavy menstrual losses and some rectal bleeding due to hemorrhoids. Ferritin is at 131 and currently adequate. 3) PICA symptoms with craving for ice starch and detergent due to iron deficiency- resolved 4) weakness fatigue and lethargy - likely related to iron deficiency anemia. Resolved 5) dysfunctional uterine bleeding - s/p trial with OCP, depo-provera - unsuccessful, had endometrial ablation in February 2017 which helped control her bleeding for a few months patient notes she is continuing to have heavy periods at this time.  PLAN -No indication for additional IV iron at this time. -Patient has not been taking her oral iron as recommended. -She was recommended again to start taking iron polysaccharide 150 mg by mouth twice a day with orange juice to help with iron replacement maintenance in the setting of ongoing menorrhagia and fatigue. -She could also take an over-the-counter multivitamin. -She was recommended to use over-the-counter stool softener as needed for constipation. -She was recommended to continue follow-up with Dr. Dellis Filbert to help address her ongoing menorrhagia despite having had an endometrial ablation. -She will follow-up with her primary care physician to address any ongoing hemorrhoidal bleeding that might be a concern. -Follow-up with primary care physician in 3-4 months with repeat CBC, CMP, ferritin, iron profile   6) strong family history of breast cancer on her mother's side. Her mother had early breast cancer and died at age 42 years. Several other female members on her maternal side with breast cancer. No known genetic  mutation. Plan -Patient has been seen by our genetic counselor Dr. Jeanine Luz and genetic testing profile  has been sent out.  Follow-up with genetic counselor to discuss results when available.  Follow-up with primary care physician in 3-4 months with repeat CBC, CMP, ferritin, iron profile  Return to clinic with Dr. Irene Limbo on an as-needed basis  All of the patients questions were answered to her apparent satisfaction. The patient knows to call the clinic with any problems, questions or concerns.  I spent 20 minutes counseling the patient face to face. The total time spent in the appointment was 25 minutes and more than 50% was on counseling and direct patient cares.    Sullivan Lone MD [place] AAHIVMS [facility] Hematology/Oncology Physician [facility]  (Office):       [phone] (Work cell):  [phone] (Fax):           [phone]

## 2016-11-21 ENCOUNTER — Telehealth: Payer: Self-pay | Admitting: Hematology

## 2016-11-21 NOTE — Telephone Encounter (Signed)
Per 11/17/2016 los. f/u as needed. - no additional appts scheduled.

## 2016-11-28 ENCOUNTER — Other Ambulatory Visit: Payer: Self-pay | Admitting: *Deleted

## 2016-11-28 ENCOUNTER — Encounter: Payer: Self-pay | Admitting: Obstetrics & Gynecology

## 2016-11-28 ENCOUNTER — Ambulatory Visit (INDEPENDENT_AMBULATORY_CARE_PROVIDER_SITE_OTHER): Payer: Managed Care, Other (non HMO) | Admitting: Obstetrics & Gynecology

## 2016-11-28 VITALS — BP 128/86 | Ht 65.0 in | Wt 199.0 lb

## 2016-11-28 DIAGNOSIS — N920 Excessive and frequent menstruation with regular cycle: Secondary | ICD-10-CM

## 2016-11-28 DIAGNOSIS — Z01411 Encounter for gynecological examination (general) (routine) with abnormal findings: Secondary | ICD-10-CM | POA: Diagnosis not present

## 2016-11-28 DIAGNOSIS — Z0000 Encounter for general adult medical examination without abnormal findings: Secondary | ICD-10-CM

## 2016-11-28 MED ORDER — NORETHINDRONE 0.35 MG PO TABS: 1.0000 | ORAL_TABLET | Freq: Every day | ORAL | 4 refills | Status: DC

## 2016-11-28 NOTE — Progress Notes (Signed)
Teresa Lynch [birth year] [id number]   History:    47 y.o.  for annual gyn exam, G5P5 Youngest 18 and 25 yo, stable boyfriend S/P BT/S, but pregnancy after tubal.  Using condoms.  Had [place] D+C, Novasure endometrial ablation, but still having heavy menses with regular monthly cycles.  IV Iron received, last Hb 13+ per patient.  No pelvic pain.  Breasts wnl.  Past medical history,surgical history, family history and social history were all reviewed and documented in the EPIC chart.  Gynecologic History Patient's last menstrual period was 10/25/2016. Contraception: condoms Last Pap: 10/2015. Results were: normal Last mammogram: 10/2015. Results were: normal  Obstetric History OB History  Gravida Para Term Preterm AB Living  5 5       5   SAB TAB Ectopic Multiple Live Births               # Outcome Date GA Lbr Len/2nd Weight Sex Delivery Anes PTL Lv  5 Para           4 Para           3 Para           2 Para           1 Para                ROS: A ROS was performed and pertinent positives and negatives are included in the history.  GENERAL: No fevers or chills. HEENT: No change in vision, no earache, sore throat or sinus congestion. NECK: No pain or stiffness. CARDIOVASCULAR: No chest pain or pressure. No palpitations. PULMONARY: No shortness of breath, cough or wheeze. GASTROINTESTINAL: No abdominal pain, nausea, vomiting or diarrhea, melena or bright red blood per rectum. GENITOURINARY: No urinary frequency, urgency, hesitancy or dysuria. MUSCULOSKELETAL: No joint or muscle pain, no back pain, no recent trauma. DERMATOLOGIC: No rash, no itching, no lesions. ENDOCRINE: No polyuria, polydipsia, no heat or cold intolerance. No recent change in weight. HEMATOLOGICAL: No anemia or easy bruising or bleeding. NEUROLOGIC: No headache, seizures, numbness, tingling or weakness. PSYCHIATRIC: No depression, no loss of interest in normal activity or change in sleep pattern.     Exam:  BP 128/86    Ht 5\' 5"  (1.651 m)   Wt 199 lb (90.3 kg)   LMP 10/25/2016   BMI 33.12 kg/m   Body mass index is 33.12 kg/m.  General appearance : Well developed well nourished female. No acute distress HEENT: Eyes: no retinal hemorrhage or exudates,  Neck supple, trachea midline, no carotid bruits, no thyroidmegaly Lungs: Clear to auscultation, no rhonchi or wheezes, or rib retractions  Heart: Regular rate and rhythm, no murmurs or gallops Breast:Examined in sitting and supine position were symmetrical in appearance, no palpable masses or tenderness,  no skin retraction, no nipple inversion, no nipple discharge, no skin discoloration, no axillary or supraclavicular lymphadenopathy Abdomen: no palpable masses or tenderness, no rebound or guarding Extremities: no edema or skin discoloration or tenderness  Pelvic:  B[place]olin, Urethra, Skene Glands: Within normal limits             Vagina: No gross lesions or discharge  Cervix: No gross lesions or discharge  Uterus  AV, normal size, shape and consistency, non-tender and mobile  Adnexa  Without masses or tenderness  Anus and perineum  normal     Assessment/Plan:  46 y.o. female for annual exam.  1. Encounter for wellness examination in adult Pap done and wnl last yr,  will repeat next Aex next yr.  Will schedule Mammo at [facility].  2. Menorrhagia with regular cycle Will attempt control with Progestin only BCP.  Risks/benefits discussed.  Last Hb 13+ after IV Iron. F/U Pelvic US next wk to r/o IU lesion.  Norethindrone sent to [facility].  Princess Bruins MD, 9:03 AM 11/28/2016

## 2016-11-28 NOTE — Patient Instructions (Signed)
Normal Aex today.  Will schedule Mammo now.  Start Progestin-only pills.  F/U ASAP for Pelvic US with me.

## 2016-12-03 ENCOUNTER — Other Ambulatory Visit: Payer: Managed Care, Other (non HMO)

## 2016-12-03 ENCOUNTER — Ambulatory Visit: Payer: Managed Care, Other (non HMO) | Admitting: Obstetrics & Gynecology

## 2016-12-03 DIAGNOSIS — Z0289 Encounter for other administrative examinations: Secondary | ICD-10-CM

## 2016-12-24 ENCOUNTER — Other Ambulatory Visit (HOSPITAL_COMMUNITY)
Admission: RE | Admit: 2016-12-24 | Discharge: 2016-12-24 | Disposition: A | Discharge status: Home / Self Care | Payer: Managed Care, Other (non HMO) | Source: Ambulatory Visit | Attending: Family Medicine | Admitting: Family Medicine

## 2016-12-24 ENCOUNTER — Other Ambulatory Visit: Payer: Self-pay | Admitting: Family Medicine

## 2016-12-24 DIAGNOSIS — Z113 Encounter for screening for infections with a predominantly sexual mode of transmission: Secondary | ICD-10-CM | POA: Insufficient documentation

## 2016-12-29 LAB — URINE CYTOLOGY ANCILLARY ONLY
CANDIDA VAGINITIS: NEGATIVE
Chlamydia: NEGATIVE
Neisseria Gonorrhea: NEGATIVE
TRICH (WINDOWPATH): NEGATIVE

## 2017-02-19 ENCOUNTER — Encounter (HOSPITAL_COMMUNITY): Payer: Self-pay

## 2017-02-19 ENCOUNTER — Ambulatory Visit (INDEPENDENT_AMBULATORY_CARE_PROVIDER_SITE_OTHER): Payer: Managed Care, Other (non HMO) | Admitting: Obstetrics & Gynecology

## 2017-02-19 ENCOUNTER — Encounter: Payer: Self-pay | Admitting: Obstetrics & Gynecology

## 2017-02-19 VITALS — BP 126/82

## 2017-02-19 DIAGNOSIS — Z113 Encounter for screening for infections with a predominantly sexual mode of transmission: Secondary | ICD-10-CM

## 2017-02-19 DIAGNOSIS — N898 Other specified noninflammatory disorders of vagina: Secondary | ICD-10-CM | POA: Diagnosis not present

## 2017-02-19 LAB — WET PREP FOR TRICH, YEAST, CLUE
Trich, Wet Prep: NONE SEEN
YEAST WET PREP: NONE SEEN

## 2017-02-19 MED ORDER — NORETHINDRONE 0.35 MG PO TABS: 1.0000 | ORAL_TABLET | Freq: Every day | ORAL | 4 refills | Status: DC

## 2017-02-19 MED ORDER — FLUCONAZOLE 150 MG PO TABS: 150.0000 mg | ORAL_TABLET | Freq: Once | ORAL | 2 refills | Status: AC

## 2017-02-19 MED ORDER — TINIDAZOLE 500 MG PO TABS: 2.0000 g | ORAL_TABLET | Freq: Every day | ORAL | 0 refills | Status: AC

## 2017-02-19 NOTE — Patient Instructions (Signed)
1. Vaginal discharge BV pos.  Treatment prescribed as below. - WET PREP FOR TRICH, YEAST, CLUE - GC/Chlamydia Probe Amp  2. Vaginal odor BV positive on wet prep.  Treatment with tinidazole prescribed.  Fluconazole PRN after treatment if Sxs of Yeast vaginitis.  Will attempt prevention with Lactobacilli tab or supp vaginally qweek after. - WET PREP FOR [place], YEAST, CLUE  3. Screen for STD (sexually transmitted disease) Condom use for prevention recommended. - GC/Chlamydia Probe Amp - HIV antibody - RPR - Hepatitis C Antibody - Hepatitis B Surface AntiGEN  Good to see you today Teresa Lynch!  I will inform you of your results as soon as available.

## 2017-02-19 NOTE — Progress Notes (Signed)
    Teresa Lynch [birth year] [id number]        46 y.o.  Gwynn partner x Annual/Gyn exam 11/2016  RP:  Increased vaginal d/c with odor  HPI:  C/O increased vaginal d/c with odor x a few days.  No pelvic pain.   No fever.  Used condoms with new partner, but would like STI screen.  No UTI Sx.  BMs wnl.    Past medical history,surgical history, problem list, medications, allergies, family history and social history were all reviewed and documented in the EPIC chart.  Directed ROS with pertinent positives and negatives documented in the history of present illness/assessment and plan.  Exam:  Vitals:   02/19/17 1159  BP: 126/82   General appearance:  Normal  Gyn exam:  Vulva normal                     Speculum:  Increased secretions.  Wet prep, Gono-Chlam done.  Cervix/Vagina normal otherwise.                     Bimanual exam:  Uterus AV, mobile, normal volume, NT to mobilization.  No adnexal mass or tenderness.  Assessment/Plan:  46 y.o. G5P5   1. Vaginal discharge BV pos.  Treatment prescribed as below. - WET PREP FOR TRICH, YEAST, CLUE - GC/Chlamydia Probe Amp  2. Vaginal odor BV positive on wet prep.  Treatment with tinidazole prescribed.  Fluconazole PRN after treatment if Sxs of Yeast vaginitis.  Will attempt prevention with Lactobacilli tab or supp vaginally qweek after. - WET PREP FOR [place], YEAST, CLUE  3. Screen for STD (sexually transmitted disease) Condom use for prevention recommended. - GC/Chlamydia Probe Amp - HIV antibody - RPR - Hepatitis C Antibody - Hepatitis B Surface AntiGEN  Counseling on above >50% x 15 minutes  Princess Bruins MD, 12:18 PM 02/19/2017

## 2017-02-20 LAB — HEPATITIS C ANTIBODY: HCV Ab: NEGATIVE

## 2017-02-20 LAB — HIV ANTIBODY (ROUTINE TESTING W REFLEX): HIV 1&2 Ab, 4th Generation: NONREACTIVE

## 2017-02-20 LAB — HEPATITIS B SURFACE ANTIGEN: HEP B S AG: NEGATIVE

## 2017-02-20 LAB — GC/CHLAMYDIA PROBE AMP
CT PROBE, AMP APTIMA: NOT DETECTED
GC PROBE AMP APTIMA: NOT DETECTED

## 2017-02-20 LAB — RPR

## 2017-07-20 ENCOUNTER — Encounter (HOSPITAL_COMMUNITY): Payer: Self-pay

## 2017-07-20 ENCOUNTER — Emergency Department (HOSPITAL_COMMUNITY): Payer: Managed Care, Other (non HMO)

## 2017-07-20 ENCOUNTER — Emergency Department (HOSPITAL_COMMUNITY)
Admission: EM | Admit: 2017-07-20 | Discharge: 2017-07-20 | Disposition: A | Discharge status: Home / Self Care | Payer: Managed Care, Other (non HMO) | Attending: Emergency Medicine | Admitting: Emergency Medicine

## 2017-07-20 DIAGNOSIS — R109 Unspecified abdominal pain: Secondary | ICD-10-CM | POA: Insufficient documentation

## 2017-07-20 DIAGNOSIS — Z79899 Other long term (current) drug therapy: Secondary | ICD-10-CM | POA: Insufficient documentation

## 2017-07-20 LAB — CBC
HCT: 35.5 % — ABNORMAL LOW (ref 36.0–46.0)
HEMOGLOBIN: 11.9 g/dL — AB (ref 12.0–15.0)
MCH: 31.2 pg (ref 26.0–34.0)
MCHC: 33.5 g/dL (ref 30.0–36.0)
MCV: 93.2 fL (ref 78.0–100.0)
Platelets: 263 10*3/uL (ref 150–400)
RBC: 3.81 MIL/uL — AB (ref 3.87–5.11)
RDW: 13 % (ref 11.5–15.5)
WBC: 5.2 10*3/uL (ref 4.0–10.5)

## 2017-07-20 LAB — BASIC METABOLIC PANEL
ANION GAP: 7 (ref 5–15)
BUN: 10 mg/dL (ref 6–20)
CALCIUM: 8.9 mg/dL (ref 8.9–10.3)
CO2: 25 mmol/L (ref 22–32)
Chloride: 106 mmol/L (ref 101–111)
Creatinine, Ser: 0.64 mg/dL (ref 0.44–1.00)
Glucose, Bld: 101 mg/dL — ABNORMAL HIGH (ref 65–99)
Potassium: 3.6 mmol/L (ref 3.5–5.1)
SODIUM: 138 mmol/L (ref 135–145)

## 2017-07-20 LAB — URINALYSIS, ROUTINE W REFLEX MICROSCOPIC
Bacteria, UA: NONE SEEN
Bilirubin Urine: NEGATIVE
Glucose, UA: NEGATIVE mg/dL
KETONES UR: NEGATIVE mg/dL
Nitrite: NEGATIVE
PROTEIN: NEGATIVE mg/dL
Specific Gravity, Urine: 1.006 (ref 1.005–1.030)
pH: 5 (ref 5.0–8.0)

## 2017-07-20 LAB — I-STAT BETA HCG BLOOD, ED (MC, WL, AP ONLY): I-stat hCG, quantitative: <5 m[IU]/mL (ref <5)

## 2017-07-20 LAB — POC URINE PREG, ED: PREG TEST UR: NEGATIVE

## 2017-07-20 MED ORDER — CYCLOBENZAPRINE HCL 10 MG PO TABS: 10.0000 mg | ORAL_TABLET | Freq: Two times a day (BID) | ORAL | 0 refills | Status: DC | PRN

## 2017-07-20 MED ORDER — SODIUM CHLORIDE 0.9 % IV SOLN
INTRAVENOUS | Status: DC
  Administered 2017-07-20: 11:00:00 via INTRAVENOUS

## 2017-07-20 MED ORDER — NAPROXEN 500 MG PO TABS: 500.0000 mg | ORAL_TABLET | Freq: Two times a day (BID) | ORAL | 0 refills | Status: DC

## 2017-07-20 MED ORDER — KETOROLAC TROMETHAMINE 30 MG/ML IJ SOLN
30.0000 mg | Freq: Once | INTRAMUSCULAR | Status: AC
  Administered 2017-07-20: 30 mg via INTRAVENOUS
  Filled 2017-07-20: qty 1

## 2017-07-20 NOTE — ED Provider Notes (Signed)
[facility] DEPT Provider Note   CSN: [id number] Arrival date & time: 07/20/17  0428     History   Chief Complaint Chief Complaint  Patient presents with  . Flank Pain    HPI Teresa Lynch is a 46 y.o. female.  HPI Pt has been having intermittent sharp pain on the left side since late last week.  The pain would come and go.  The episodes would last 10-20 minutes.  Nothing in particular brought it on, movements, positions, eating etc.  Last night the pain got worse and did not go away so she came to the ED.  Some nausea, no vomiting.  She has felt lightheaded.  No diarrhea, vaginal discharge, bleeding, no dysuria.     Past Medical History:  Diagnosis Date  . Anemia   . Headache    Migraines  . Heart murmur    only when pregnant in 2001  . Vaginal delivery 1991, 1993, 1994, 1998, 2001    Patient Active Problem List   Diagnosis Date Noted  . Family history of breast cancer in female 05/30/2016  . Iron deficiency anemia 05/23/2016  . Paresthesia 04/16/2016    Past Surgical History:  Procedure Laterality Date  . DILITATION & CURRETTAGE/HYSTROSCOPY WITH NOVASURE ABLATION N/A 10/18/2015   Procedure: DILATATION & CURETTAGE/HYSTEROSCOPY WITH NOVASURE ABLATION;  Surgeon: Princess Bruins, MD;  Location: [facility];  Service: Gynecology;  Laterality: N/A;  . TUBAL LIGATION    . WISDOM TOOTH EXTRACTION      OB History    Gravida Para Term Preterm AB Living   5 5       5    SAB TAB Ectopic Multiple Live Births                   Home Medications    Prior to Admission medications   Medication Sig Start Date End Date Taking? Authorizing Provider  ibuprofen (ADVIL,MOTRIN) 200 MG tablet Take 200-400 mg by mouth daily as needed (pain).   Yes [provider]  cyclobenzaprine (FLEXERIL) 10 MG tablet Take 1 tablet (10 mg total) by mouth 2 (two) times daily as needed for muscle spasms. 07/20/17   Dorie Rank, MD  naproxen (NAPROSYN) 500 MG  tablet Take 1 tablet (500 mg total) by mouth 2 (two) times daily. 07/20/17   Dorie Rank, MD  norethindrone (MICRONOR,CAMILA,ERRIN) 0.35 MG tablet Take 1 tablet (0.35 mg total) by mouth daily. Patient not taking: Reported on 07/20/2017 02/19/17   Princess Bruins, MD    Family History Family History  Problem Relation Age of Onset  . Breast cancer Mother        d. 62  . Sarcoidosis Maternal Aunt        d. 3s  . Breast cancer Maternal Aunt        dx 22s or younger  . Cirrhosis Maternal Uncle        +EtOH abuse; d. 24  . Breast cancer Paternal Aunt        dx under 49y; d. 63y  . Dementia Maternal Grandmother        d. 16  . Breast cancer Maternal Grandmother        dx under age of 51  . Heart attack Maternal Grandfather        d. 32s  . Down syndrome Sister        d. 58  . Cancer Maternal Uncle        d. 25s; unspecified type;  smoker  . Breast cancer Cousin 25       maternal 1st cousin; d. 15  . Down syndrome Maternal Aunt        d. late 95s-30s    Social History Social History   Tobacco Use  . Smoking status: Never Smoker  . Smokeless tobacco: Never Used  Substance Use Topics  . Alcohol use: Yes    Comment: social  . Drug use: No     Allergies   Patient has no known allergies.   Review of Systems Review of Systems  All other systems reviewed and are negative.    Physical Exam Updated Vital Signs BP 138/89 (BP Location: Left Arm)   Pulse 68   Temp 97.8 F (36.6 C) (Oral)   Resp 16   Ht 1.676 m (5\' 6" )   Wt 93.9 kg (207 lb)   SpO2 100%   BMI 33.41 kg/m   Physical Exam  Constitutional: She appears well-developed and well-nourished. No distress.  HENT:  Head: Normocephalic and atraumatic.  Right Ear: External ear normal.  Left Ear: External ear normal.  Eyes: Conjunctivae are normal. Right eye exhibits no discharge. Left eye exhibits no discharge. No scleral icterus.  Neck: Neck supple. No tracheal deviation present.  Cardiovascular: Normal  rate, regular rhythm and intact distal pulses.  Pulmonary/Chest: Effort normal and breath sounds normal. No stridor. No respiratory distress. She has no wheezes. She has no rales.  Abdominal: Soft. Bowel sounds are normal. She exhibits no distension. There is no tenderness. There is CVA tenderness (left sided). There is no rebound and no guarding.  Musculoskeletal: She exhibits no edema or tenderness.  Neurological: She is alert. She has normal strength. No cranial nerve deficit (no facial droop, extraocular movements intact, no slurred speech) or sensory deficit. She exhibits normal muscle tone. She displays no seizure activity. Coordination normal.  Skin: Skin is warm and dry. No rash noted.  Psychiatric: She has a normal mood and affect.  Nursing note and vitals reviewed.    ED Treatments / Results  Labs (all labs ordered are listed, but only abnormal results are displayed) Labs Reviewed  URINALYSIS, ROUTINE W REFLEX MICROSCOPIC - Abnormal; Notable for the following components:      Result Value   APPearance HAZY (*)    Hgb urine dipstick SMALL (*)    Leukocytes, UA TRACE (*)    Squamous Epithelial / LPF 6-30 (*)    All other components within normal limits  BASIC METABOLIC PANEL - Abnormal; Notable for the following components:   Glucose, Bld 101 (*)    All other components within normal limits  CBC - Abnormal; Notable for the following components:   RBC 3.81 (*)    Hemoglobin 11.9 (*)    HCT 35.5 (*)    All other components within normal limits  I-STAT BETA HCG BLOOD, ED ([facility], [facility], [facility] ONLY)  POC URINE PREG, ED     Radiology Ct Abdomen Pelvis Wo Contrast  Result Date: 07/20/2017 CLINICAL DATA:  Left flank pain EXAM: CT ABDOMEN AND PELVIS WITHOUT CONTRAST TECHNIQUE: Multidetector CT imaging of the abdomen and pelvis was performed following the standard protocol without IV contrast. COMPARISON:  None. FINDINGS: Lower chest: No acute abnormality. Hepatobiliary: No focal hepatic  abnormality. Gallbladder unremarkable. Pancreas: No focal abnormality or ductal dilatation. Spleen: No focal abnormality.  Normal size. Adrenals/Urinary Tract: Punctate nonobstructing stones in the lower pole of the right kidney. No stones on the left. No hydronephrosis. Urinary bladder and  adrenal glands unremarkable. Stomach/Bowel: Normal appendix. Stomach, large and small bowel grossly unremarkable. Vascular/Lymphatic: No evidence of aneurysm or adenopathy. Reproductive: Uterus and adnexa unremarkable.  No mass. Other: No free fluid or free air. Musculoskeletal: No acute bony abnormality. IMPRESSION: Punctate nephrolithiasis in the right lower pole. No renal or ureteral stones on the left. No hydronephrosis. Electronically Signed   By: Rolm Baptise M.D.   On: 07/20/2017 10:59    Procedures Procedures (including critical care time)  Medications Ordered in ED Medications  0.9 %  sodium chloride infusion ( Intravenous New Bag/Given 07/20/17 1038)  ketorolac (TORADOL) 30 MG/ML injection 30 mg (30 mg Intravenous Given 07/20/17 1038)     Initial Impression / Assessment and Plan / ED Course  I have reviewed the triage vital signs and the nursing notes.  Pertinent labs & imaging results that were available during my care of the patient were reviewed by me and considered in my medical decision making (see chart for details).   Patient presented to the emergency room with complaints of left flank pain.  Urinalysis was dipstick positive for hemoglobin.  CT scan was done to evaluate for ureteral stone.  Incidental punctate stone noted in the right kidney but no evidence of urine or ureteral stones on the left.  It is possible the patient may have passed a small kidney stone on the left and is just not evident at this time.  It is also possible symptoms be more musculoskeletal in nature.  At this time there does not appear to be any evidence of an acute emergency medical condition and the patient appears  stable for discharge with appropriate outpatient follow up.   Final Clinical Impressions(s) / ED Diagnoses   Final diagnoses:  Flank pain    ED Discharge Orders        Ordered    naproxen (NAPROSYN) 500 MG tablet  2 times daily     07/20/17 1129    cyclobenzaprine (FLEXERIL) 10 MG tablet  2 times daily PRN     07/20/17 1129       Dorie Rank, MD 07/20/17 1133

## 2017-07-20 NOTE — ED Notes (Signed)
ED Provider at bedside. 

## 2017-07-20 NOTE — ED Triage Notes (Signed)
Pt complains of left flank pain since Thursday, no hx of kidney stones

## 2017-07-20 NOTE — Discharge Instructions (Signed)
Take the medications as needed for pain.  Follow-up with your primary care doctor if symptoms have not resolved.  As we discussed the CT scan showed an incidental small knee stone in the right kidney but no signs of kidney stones on the left where you have been having your  pain.

## 2017-07-20 NOTE — ED Notes (Signed)
Patient transported to CT 

## 2017-10-05 ENCOUNTER — Ambulatory Visit (INDEPENDENT_AMBULATORY_CARE_PROVIDER_SITE_OTHER): Payer: Managed Care, Other (non HMO) | Admitting: Obstetrics & Gynecology

## 2017-10-05 ENCOUNTER — Encounter: Payer: Self-pay | Admitting: Obstetrics & Gynecology

## 2017-10-05 VITALS — BP 122/90

## 2017-10-05 DIAGNOSIS — B9689 Other specified bacterial agents as the cause of diseases classified elsewhere: Secondary | ICD-10-CM | POA: Diagnosis not present

## 2017-10-05 DIAGNOSIS — N760 Acute vaginitis: Secondary | ICD-10-CM | POA: Diagnosis not present

## 2017-10-05 DIAGNOSIS — B373 Candidiasis of vulva and vagina: Secondary | ICD-10-CM | POA: Diagnosis not present

## 2017-10-05 DIAGNOSIS — Z8742 Personal history of other diseases of the female genital tract: Secondary | ICD-10-CM | POA: Diagnosis not present

## 2017-10-05 DIAGNOSIS — B3731 Acute candidiasis of vulva and vagina: Secondary | ICD-10-CM

## 2017-10-05 LAB — WET PREP FOR TRICH, YEAST, CLUE

## 2017-10-05 MED ORDER — TINIDAZOLE 500 MG PO TABS: 2.0000 g | ORAL_TABLET | Freq: Every day | ORAL | 2 refills | Status: AC

## 2017-10-05 MED ORDER — FLUCONAZOLE 150 MG PO TABS: 150.0000 mg | ORAL_TABLET | Freq: Every day | ORAL | 2 refills | Status: AC

## 2017-10-05 NOTE — Progress Notes (Signed)
    Teresa Lynch 1970/11/26 [id number]        47 y.o.  G5P5 Same boyfriend  RP: Recurrent Bacterial Vaginosis/Yeast Vaginitis  HPI: Currently having a vaginal discharge with odor x 2-3 weeks.  Treated for BV 01/2017.  STI screen all negative at that time, same boyfriend, declines repeat STI screen.  Since then, used [facility] for abnormal vaginal discharge and was treated for both yeast vaginitis and bacterial vaginosis many times.  Last treatment with [facility] was for a bladder infection.  No current UTI Sx.  No pelvic pain.  No fever.   OB History  Gravida Para Term Preterm AB Living  5 5       5   SAB TAB Ectopic Multiple Live Births               # Outcome Date GA Lbr Len/2nd Weight Sex Delivery Anes PTL Lv  5 Para           4 Para           3 Para           2 Para           1 Para               Past medical history,surgical history, problem list, medications, allergies, family history and social history were all reviewed and documented in the EPIC chart.   Directed ROS with pertinent positives and negatives documented in the history of present illness/assessment and plan.  Exam:  Vitals:   10/05/17 1036  BP: 122/90   General appearance:  Normal  Abdomen: Normal  Gynecologic exam: Vulva normal.  Speculum:  Cervis/Vagina normal.  Increased vaginal discharge.  Wet prep done.   Assessment/Plan:  47 y.o. G5P5   1. History of recurrent vaginal discharge Wet prep done showing both bacterial vaginosis and yeast vaginitis. - WET PREP FOR [place], YEAST, CLUE  2. Bacterial vaginosis Start tinidazole 4 tablets today and 4 tablets tomorrow.  Will follow with fluconazole 1 tablet every day for 3 days.  Will repeat that treatment next week.  And then observe on probiotic tablets vaginally every week.  3. Yeast vaginitis Treatment as above.  Other orders - tinidazole (TINDAMAX) 500 MG tablet; Take 4 tablets (2,000 mg total) by mouth daily for 2 days. - fluconazole  (DIFLUCAN) 150 MG tablet; Take 1 tablet (150 mg total) by mouth daily for 3 days.  Counseling on above issues more than 50% for 15 minutes.  Princess Bruins MD, 10:46 AM 10/05/2017

## 2017-10-05 NOTE — Patient Instructions (Signed)
1. History of recurrent vaginal discharge Wet prep done showing both bacterial vaginosis and yeast vaginitis. - WET PREP FOR Teresa Lynch, YEAST, CLUE  2. Bacterial vaginosis Start tinidazole 4 tablets today and 4 tablets tomorrow.  Will follow with fluconazole 1 tablet every day for 3 days.  Will repeat that treatment next week.  And then observe on probiotic tablets vaginally every week.  3. Yeast vaginitis Treatment as above.  Other orders - tinidazole (TINDAMAX) 500 MG tablet; Take 4 tablets (2,000 mg total) by mouth daily for 2 days. - fluconazole (DIFLUCAN) 150 MG tablet; Take 1 tablet (150 mg total) by mouth daily for 3 days.  Teresa Lynch, good seeing you today!   Bacterial Vaginosis Bacterial vaginosis is a vaginal infection that occurs when the normal balance of bacteria in the vagina is disrupted. It results from an overgrowth of certain bacteria. This is the most common vaginal infection among women ages 73-44. Because bacterial vaginosis increases your risk for STIs (sexually transmitted infections), getting treated can help reduce your risk for chlamydia, gonorrhea, herpes, and HIV (human immunodeficiency virus). Treatment is also important for preventing complications in pregnant women, because this condition can cause an early (premature) delivery. What are the causes? This condition is caused by an increase in harmful bacteria that are normally present in small amounts in the vagina. However, the reason that the condition develops is not fully understood. What increases the risk? The following factors may make you more likely to develop this condition:  Having a new sexual partner or multiple sexual partners.  Having unprotected sex.  Douching.  Having an intrauterine device (IUD).  Smoking.  Drug and alcohol abuse.  Taking certain antibiotic medicines.  Being pregnant.  You cannot get bacterial vaginosis from toilet seats, bedding, swimming pools, or contact with  objects around you. What are the signs or symptoms? Symptoms of this condition include:  Grey or white vaginal discharge. The discharge can also be watery or foamy.  A fish-like odor with discharge, especially after sexual intercourse or during menstruation.  Itching in and around the vagina.  Burning or pain with urination.  Some women with bacterial vaginosis have no signs or symptoms. How is this diagnosed? This condition is diagnosed based on:  Your medical history.  A physical exam of the vagina.  Testing a sample of vaginal fluid under a microscope to look for a large amount of bad bacteria or abnormal cells. Your health care provider may use a cotton swab or a small wooden spatula to collect the sample.  How is this treated? This condition is treated with antibiotics. These may be given as a pill, a vaginal cream, or a medicine that is put into the vagina (suppository). If the condition comes back after treatment, a second round of antibiotics may be needed. Follow these instructions at home: Medicines  Take over-the-counter and prescription medicines only as told by your health care provider.  Take or use your antibiotic as told by your health care provider. Do not stop taking or using the antibiotic even if you start to feel better. General instructions  If you have a female sexual partner, tell her that you have a vaginal infection. She should see her health care provider and be treated if she has symptoms. If you have a female sexual partner, he does not need treatment.  During treatment: ? Avoid sexual activity until you finish treatment. ? Do not douche. ? Avoid alcohol as directed by your health care provider. ? Avoid  breastfeeding as directed by your health care provider.  Drink enough water and fluids to keep your urine clear or pale yellow.  Keep the area around your vagina and rectum clean. ? Wash the area daily with warm water. ? Wipe yourself from front  to back after using the toilet.  Keep all follow-up visits as told by your health care provider. This is important. How is this prevented?  Do not douche.  Wash the outside of your vagina with warm water only.  Use protection when having sex. This includes latex condoms and dental dams.  Limit how many sexual partners you have. To help prevent bacterial vaginosis, it is best to have sex with just one partner (monogamous).  Make sure you and your sexual partner are tested for STIs.  Wear cotton or cotton-lined underwear.  Avoid wearing tight pants and pantyhose, especially during summer.  Limit the amount of alcohol that you drink.  Do not use any products that contain nicotine or tobacco, such as cigarettes and e-cigarettes. If you need help quitting, ask your health care provider.  Do not use illegal drugs. Where to find more information:  Centers for Disease Control and Prevention: AppraiserFraud.fi  American Sexual Health Association (ASHA): www.ashastd.org  [place] Department of Health and Financial controller, Office on Women's Health: DustingSprays.pl or SecuritiesCard.it Contact a health care provider if:  Your symptoms do not improve, even after treatment.  You have more discharge or pain when urinating.  You have a fever.  You have pain in your abdomen.  You have pain during sex.  You have vaginal bleeding between periods. Summary  Bacterial vaginosis is a vaginal infection that occurs when the normal balance of bacteria in the vagina is disrupted.  Because bacterial vaginosis increases your risk for STIs (sexually transmitted infections), getting treated can help reduce your risk for chlamydia, gonorrhea, herpes, and HIV (human immunodeficiency virus). Treatment is also important for preventing complications in pregnant women, because the condition can cause an early (premature) delivery.  This condition is treated with  antibiotic medicines. These may be given as a pill, a vaginal cream, or a medicine that is put into the vagina (suppository). This information is not intended to replace advice given to you by your health care provider. Make sure you discuss any questions you have with your health care provider. Document Released: 08/11/2005 Document Revised: 12/15/2016 Document Reviewed: 04/26/2016 Elsevier Interactive Patient Education  2018 [employer].  Vaginal Yeast infection, Adult Vaginal yeast infection is a condition that causes soreness, swelling, and redness (inflammation) of the vagina. It also causes vaginal discharge. This is a common condition. Some women get this infection frequently. What are the causes? This condition is caused by a change in the normal balance of the yeast (candida) and bacteria that live in the vagina. This change causes an overgrowth of yeast, which causes the inflammation. What increases the risk? This condition is more likely to develop in:  Women who take antibiotic medicines.  Women who have diabetes.  Women who take birth control pills.  Women who are pregnant.  Women who douche often.  Women who have a weak defense (immune) system.  Women who have been taking steroid medicines for a long time.  Women who frequently wear tight clothing.  What are the signs or symptoms? Symptoms of this condition include:  White, thick vaginal discharge.  Swelling, itching, redness, and irritation of the vagina. The lips of the vagina (vulva) may be affected as well.  Pain  or a burning feeling while urinating.  Pain during sex.  How is this diagnosed? This condition is diagnosed with a medical history and physical exam. This will include a pelvic exam. Your health care provider will examine a sample of your vaginal discharge under a microscope. Your health care provider may send this sample for testing to confirm the diagnosis. How is this treated? This condition  is treated with medicine. Medicines may be over-the-counter or prescription. You may be told to use one or more of the following:  Medicine that is taken orally.  Medicine that is applied as a cream.  Medicine that is inserted directly into the vagina (suppository).  Follow these instructions at home:  Take or apply over-the-counter and prescription medicines only as told by your health care provider.  Do not have sex until your health care provider has approved. Tell your sex partner that you have a yeast infection. That person should go to his or her health care provider if he or she develops symptoms.  Do not wear tight clothes, such as pantyhose or tight pants.  Avoid using tampons until your health care provider approves.  Eat more yogurt. This may help to keep your yeast infection from returning.  Try taking a sitz bath to help with discomfort. This is a warm water bath that is taken while you are sitting down. The water should only come up to your hips and should cover your buttocks. Do this 3-4 times per day or as told by your health care provider.  Do not douche.  Wear breathable, cotton underwear.  If you have diabetes, keep your blood sugar levels under control. Contact a health care provider if:  You have a fever.  Your symptoms go away and then return.  Your symptoms do not get better with treatment.  Your symptoms get worse.  You have new symptoms.  You develop blisters in or around your vagina.  You have blood coming from your vagina and it is not your menstrual period.  You develop pain in your abdomen. This information is not intended to replace advice given to you by your health care provider. Make sure you discuss any questions you have with your health care provider. Document Released: 05/21/2005 Document Revised: 01/23/2016 Document Reviewed: 02/12/2015 Elsevier Interactive Patient Education  2018 [employer].

## 2017-12-07 ENCOUNTER — Encounter: Payer: Managed Care, Other (non HMO) | Admitting: Obstetrics & Gynecology

## 2017-12-25 ENCOUNTER — Ambulatory Visit (INDEPENDENT_AMBULATORY_CARE_PROVIDER_SITE_OTHER): Payer: Managed Care, Other (non HMO) | Admitting: Obstetrics & Gynecology

## 2017-12-25 ENCOUNTER — Encounter: Payer: Self-pay | Admitting: Obstetrics & Gynecology

## 2017-12-25 ENCOUNTER — Other Ambulatory Visit: Payer: Self-pay | Admitting: Obstetrics & Gynecology

## 2017-12-25 VITALS — BP 124/78 | Ht 65.0 in | Wt 190.6 lb

## 2017-12-25 DIAGNOSIS — Z01419 Encounter for gynecological examination (general) (routine) without abnormal findings: Secondary | ICD-10-CM | POA: Diagnosis not present

## 2017-12-25 DIAGNOSIS — Z113 Encounter for screening for infections with a predominantly sexual mode of transmission: Secondary | ICD-10-CM

## 2017-12-25 DIAGNOSIS — Z1231 Encounter for screening mammogram for malignant neoplasm of breast: Secondary | ICD-10-CM

## 2017-12-25 DIAGNOSIS — N911 Secondary amenorrhea: Secondary | ICD-10-CM | POA: Diagnosis not present

## 2017-12-25 DIAGNOSIS — Z1151 Encounter for screening for human papillomavirus (HPV): Secondary | ICD-10-CM

## 2017-12-25 DIAGNOSIS — Z9851 Tubal ligation status: Secondary | ICD-10-CM

## 2017-12-25 NOTE — Progress Notes (Signed)
Teresa Lynch 1970/12/12 [id number]   History:    47 y.o. G5P5L5 Stable boyfriend.  S/P Tubal Ligation  RP:  Established patient presenting for annual gyn exam   HPI: Amenorrhea x 1 year.  Night sweats.  Low moods, seen by her Fam MD and started on an AntiDepressant 2 weeks ago.  No suicidal ideation.  No pelvic pain.  Normal vaginal secretions.  No pain with IC.  Urine/BMs wnl.  Breasts wnl.  Health labs with family physician.  Past medical history,surgical history, family history and social history were all reviewed and documented in the EPIC chart.  Gynecologic History No LMP recorded. Patient has had an ablation. Contraception: condoms and tubal ligation Last Pap: 2017. Results were: Normal Last mammogram: 10/2015. Results were: Negative Bone Density: Never Colonoscopy: Never  Obstetric History OB History  Gravida Para Term Preterm AB Living  5 5       5   SAB TAB Ectopic Multiple Live Births               # Outcome Date GA Lbr Len/2nd Weight Sex Delivery Anes PTL Lv  5 Para           4 Para           3 Para           2 Para           1 Para              ROS: A ROS was performed and pertinent positives and negatives are included in the history.  GENERAL: No fevers or chills. HEENT: No change in vision, no earache, sore throat or sinus congestion. NECK: No pain or stiffness. CARDIOVASCULAR: No chest pain or pressure. No palpitations. PULMONARY: No shortness of breath, cough or wheeze. GASTROINTESTINAL: No abdominal pain, nausea, vomiting or diarrhea, melena or bright red blood per rectum. GENITOURINARY: No urinary frequency, urgency, hesitancy or dysuria. MUSCULOSKELETAL: No joint or muscle pain, no back pain, no recent trauma. DERMATOLOGIC: No rash, no itching, no lesions. ENDOCRINE: No polyuria, polydipsia, no heat or cold intolerance. No recent change in weight. HEMATOLOGICAL: No anemia or easy bruising or bleeding. NEUROLOGIC: No headache, seizures, numbness, tingling  or weakness. PSYCHIATRIC: No depression, no loss of interest in normal activity or change in sleep pattern.     Exam:   BP 124/78   Ht 5\' 5"  (1.651 m)   Wt 190 lb 9.6 oz (86.5 kg)   BMI 31.72 kg/m   Body mass index is 31.72 kg/m.  General appearance : Well developed well nourished female. No acute distress HEENT: Eyes: no retinal hemorrhage or exudates,  Neck supple, trachea midline, no carotid bruits, no thyroidmegaly Lungs: Clear to auscultation, no rhonchi or wheezes, or rib retractions  Heart: Regular rate and rhythm, no murmurs or gallops Breast:Examined in sitting and supine position were symmetrical in appearance, no palpable masses or tenderness,  no skin retraction, no nipple inversion, no nipple discharge, no skin discoloration, no axillary or supraclavicular lymphadenopathy Abdomen: no palpable masses or tenderness, no rebound or guarding Extremities: no edema or skin discoloration or tenderness  Pelvic: Vulva: Normal             Vagina: No gross lesions or discharge  Cervix: No gross lesions or discharge.  Pap/HPV HR/Gono-Chlam done.  Uterus  AV, normal size, shape and consistency, non-tender and mobile  Adnexa  Without masses or tenderness  Anus: Normal   Assessment/Plan:  47 y.o.  female for annual exam   1. Encounter for routine gynecological examination with Papanicolaou smear of cervix Normal gynecologic exam.  Pap with high-risk HPV done today.  Breast exam normal.  Will schedule screening mammogram now.  Health labs with family physician.  2. Tubal ligation status History of tubal ligation but pregnancy, after sterilization.  Therefore using condoms as well.  3. Secondary amenorrhea Possible menopause or perimenopause.  We will do an [facility] to evaluate today.  Management per results. - FSH  4. Screen for STD (sexually transmitted disease) Using condoms. - Gono-Chlam on Pap - HIV antibody (with reflex) - RPR - Hepatitis C Antibody - Hepatitis B Surface  AntiGEN  Princess Bruins MD, 12:40 PM 12/25/2017

## 2017-12-26 ENCOUNTER — Encounter: Payer: Self-pay | Admitting: Obstetrics & Gynecology

## 2017-12-26 NOTE — Patient Instructions (Signed)
1. Encounter for routine gynecological examination with Papanicolaou smear of cervix Normal gynecologic exam.  Pap with high-risk HPV done today.  Breast exam normal.  Will schedule screening mammogram now.  Health labs with family physician.  2. Tubal ligation status History of tubal ligation but pregnancy, after sterilization.  Therefore using condoms as well.  3. Secondary amenorrhea Possible menopause or perimenopause.  We will do an [facility] to evaluate today.  Management per results. - FSH  4. Screen for STD (sexually transmitted disease) Using condoms. - Gono-Chlam on Pap - HIV antibody (with reflex) - RPR - Hepatitis C Antibody - Hepatitis B Surface AntiGEN  [place], it was a pleasure seeing you today!  I will inform you of your results as soon as they are available.

## 2017-12-28 ENCOUNTER — Other Ambulatory Visit: Payer: Self-pay | Admitting: Obstetrics & Gynecology

## 2017-12-28 DIAGNOSIS — N912 Amenorrhea, unspecified: Secondary | ICD-10-CM

## 2017-12-28 LAB — HEPATITIS B SURFACE ANTIGEN: Hepatitis B Surface Ag: NONREACTIVE

## 2017-12-28 LAB — HEPATITIS C ANTIBODY
HEP C AB: NONREACTIVE
SIGNAL TO CUT-OFF: 0.01 (ref <1.00)

## 2017-12-28 LAB — HIV ANTIBODY (ROUTINE TESTING W REFLEX): HIV 1&2 Ab, 4th Generation: NONREACTIVE

## 2017-12-28 LAB — RPR: RPR Ser Ql: NONREACTIVE

## 2017-12-28 LAB — FOLLICLE STIMULATING HORMONE: FSH: 4 m[IU]/mL

## 2017-12-29 LAB — PAP IG, CT-NG NAA, HPV HIGH-RISK
C. trachomatis RNA, TMA: NOT DETECTED
HPV DNA High Risk: NOT DETECTED
N. GONORRHOEAE RNA, TMA: NOT DETECTED

## 2018-01-19 ENCOUNTER — Ambulatory Visit: Payer: Managed Care, Other (non HMO)

## 2018-04-28 ENCOUNTER — Other Ambulatory Visit: Payer: Self-pay | Admitting: Obstetrics & Gynecology

## 2018-09-01 ENCOUNTER — Encounter: Payer: Self-pay | Admitting: Obstetrics & Gynecology

## 2018-09-01 ENCOUNTER — Ambulatory Visit (INDEPENDENT_AMBULATORY_CARE_PROVIDER_SITE_OTHER): Payer: Commercial Managed Care - PPO | Admitting: Obstetrics & Gynecology

## 2018-09-01 VITALS — BP 120/80

## 2018-09-01 DIAGNOSIS — N898 Other specified noninflammatory disorders of vagina: Secondary | ICD-10-CM

## 2018-09-01 LAB — WET PREP FOR TRICH, YEAST, CLUE

## 2018-09-01 MED ORDER — FLUCONAZOLE 150 MG PO TABS: 150.0000 mg | ORAL_TABLET | Freq: Every day | ORAL | 1 refills | Status: AC

## 2018-09-01 MED ORDER — TINIDAZOLE 500 MG PO TABS: 2.0000 g | ORAL_TABLET | Freq: Every day | ORAL | 1 refills | Status: AC

## 2018-09-01 NOTE — Progress Notes (Signed)
    Teresa Lynch Sep 14, 1970 [id number]        48 y.o.  G5P5L5 stable boyfriend.  Status post bilateral tubal ligation.  RP: Vaginal discharge with odor  HPI: Complains of vaginal discharge with odor.  No pelvic pain.  Status post endometrial ablation.  No vaginal bleeding.  Declines STD screening.   OB History  Gravida Para Term Preterm AB Living  5 5       5   SAB TAB Ectopic Multiple Live Births               # Outcome Date GA Lbr Len/2nd Weight Sex Delivery Anes PTL Lv  5 Para           4 Para           3 Para           2 Para           1 Para             Past medical history,surgical history, problem list, medications, allergies, family history and social history were all reviewed and documented in the EPIC chart.   Directed ROS with pertinent positives and negatives documented in the history of present illness/assessment and plan.  Exam:  Vitals:   09/01/18 1004  BP: 120/80   General appearance:  Normal  Abdomen: Normal  Gynecologic exam: Vulva Normal.  Speculum: Cervix and vagina normal.  Increased discharge.  Wet prep done.  Wet Prep:  Clue cells present   Assessment/Plan:  48 y.o. G5PL5   1. Vaginal odor Bacterial vaginosis clinically and confirmed by wet prep.  Will treat with tinidazole and then give fluconazole to prevent a yeast vaginitis.  Usage reviewed with patient and prescription sent to pharmacy. - WET PREP FOR [place], YEAST, CLUE  Other orders - tinidazole (TINDAMAX) 500 MG tablet; Take 4 tablets (2,000 mg total) by mouth daily for 2 days. - fluconazole (DIFLUCAN) 150 MG tablet; Take 1 tablet (150 mg total) by mouth daily for 3 days.  Counseling on above issues and coordination of care more than 50% for 15 minutes.  Princess Bruins MD, 10:26 AM 09/01/2018

## 2018-09-05 ENCOUNTER — Encounter: Payer: Self-pay | Admitting: Obstetrics & Gynecology

## 2018-09-05 NOTE — Patient Instructions (Addendum)
1. Vaginal odor Bacterial vaginosis clinically and confirmed by wet prep.  Will treat with tinidazole and then give fluconazole to prevent a yeast vaginitis.  Usage reviewed with patient and prescription sent to pharmacy. - WET PREP FOR [place], YEAST, CLUE  Other orders - tinidazole (TINDAMAX) 500 MG tablet; Take 4 tablets (2,000 mg total) by mouth daily for 2 days. - fluconazole (DIFLUCAN) 150 MG tablet; Take 1 tablet (150 mg total) by mouth daily for 3 days.  Teresa Lynch, it was a pleasure seeing you today!

## 2018-11-02 ENCOUNTER — Other Ambulatory Visit (HOSPITAL_COMMUNITY)
Admission: RE | Admit: 2018-11-02 | Discharge: 2018-11-02 | Disposition: A | Discharge status: Home / Self Care | Payer: Commercial Managed Care - PPO | Source: Ambulatory Visit | Attending: Nurse Practitioner | Admitting: Nurse Practitioner

## 2018-11-02 ENCOUNTER — Other Ambulatory Visit: Payer: Self-pay

## 2018-11-02 DIAGNOSIS — G43909 Migraine, unspecified, not intractable, without status migrainosus: Secondary | ICD-10-CM | POA: Diagnosis not present

## 2018-11-02 DIAGNOSIS — N898 Other specified noninflammatory disorders of vagina: Secondary | ICD-10-CM | POA: Diagnosis not present

## 2018-11-02 DIAGNOSIS — N760 Acute vaginitis: Secondary | ICD-10-CM | POA: Diagnosis not present

## 2018-11-03 IMAGING — CT CT ABD-PELV W/O CM
2 of 3 series · 17 of 46 positions shown, 19 images · non-contrast
Comparison: None.

CLINICAL DATA: Left flank pain

EXAM:
CT ABDOMEN AND PELVIS WITHOUT CONTRAST
TECHNIQUE: Multidetector CT imaging of the abdomen and pelvis was performed
following the standard protocol without IV contrast.

[Series 4: lung · axial · 0.71mm/px · z∈[-173,-81]mm · 14 of 54 slices shown, 16 images]
[im 4/54  soft-tissue]
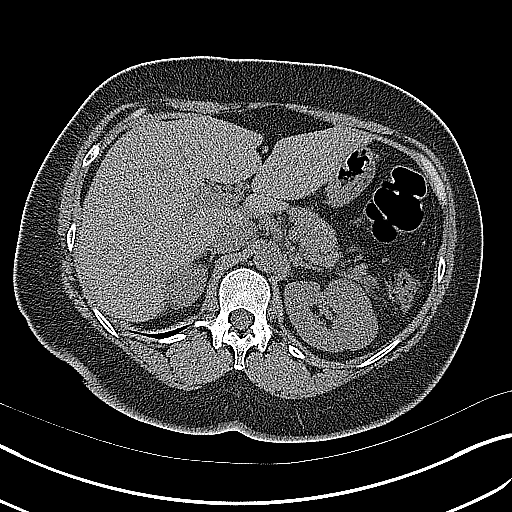
[im 4/54  bone]
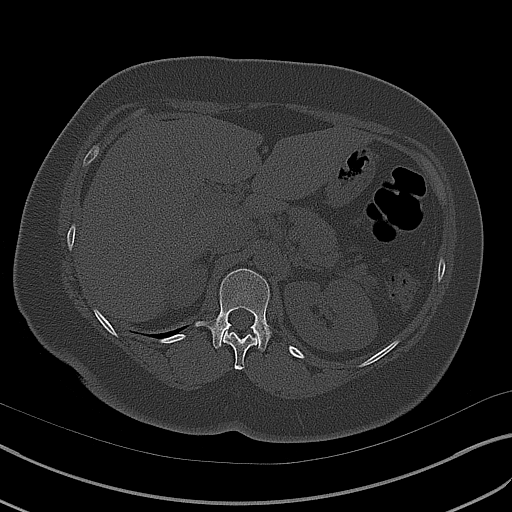
[im 7/54  soft-tissue]
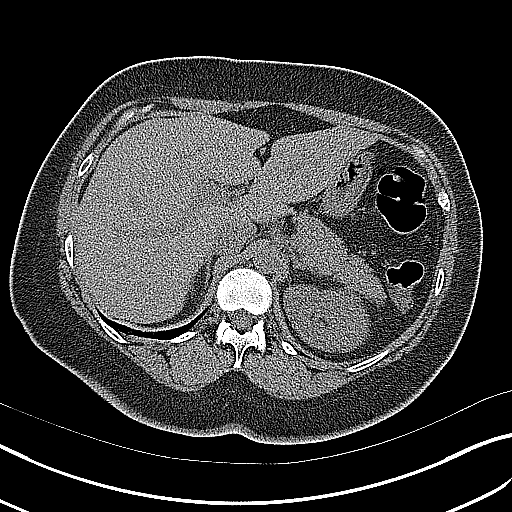
[im 11/54  soft-tissue]
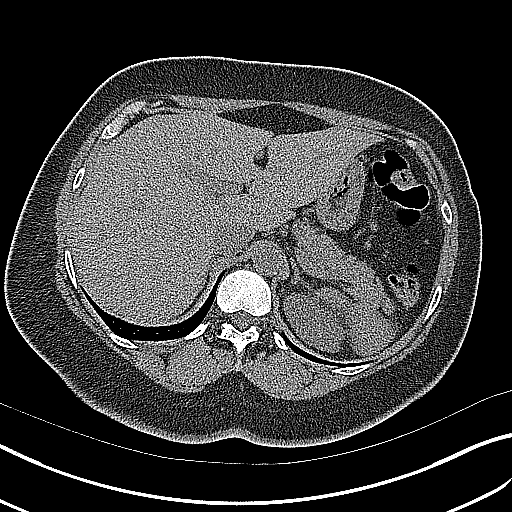
[im 14/54  soft-tissue]
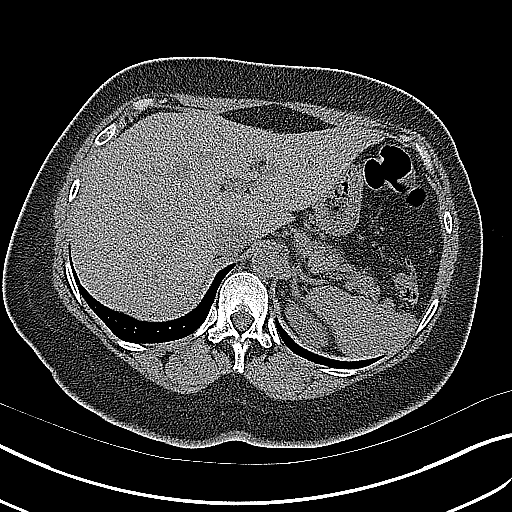
[im 18/54  soft-tissue]
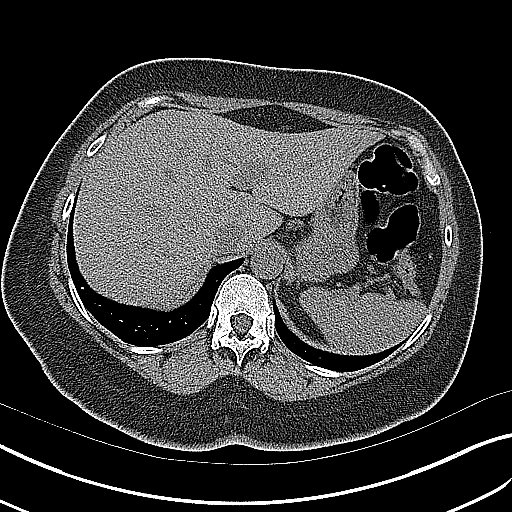
[im 21/54  soft-tissue]
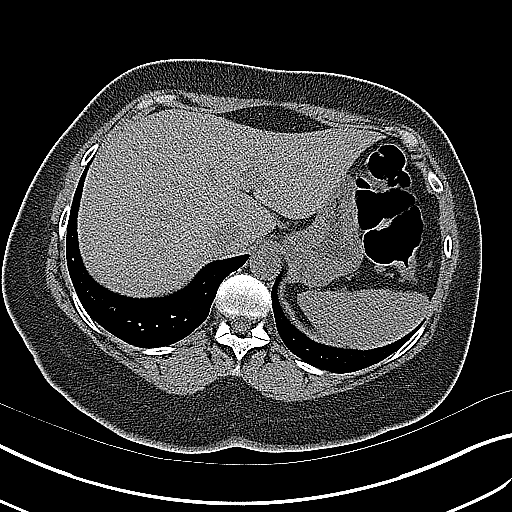
[im 24/54  soft-tissue]
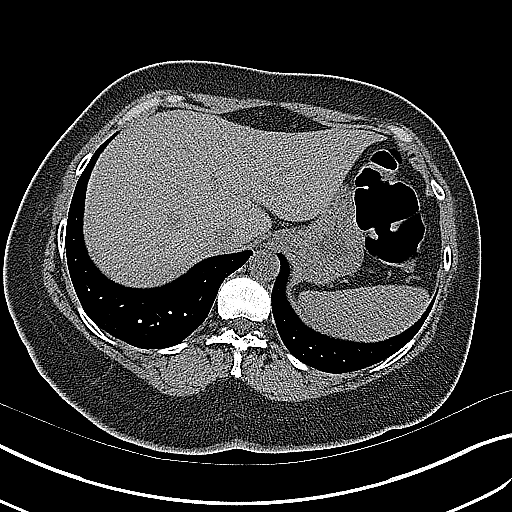
[im 30/54  soft-tissue]
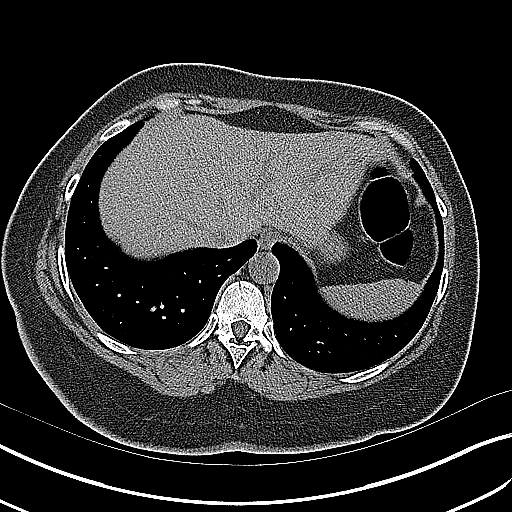
[im 33/54  soft-tissue]
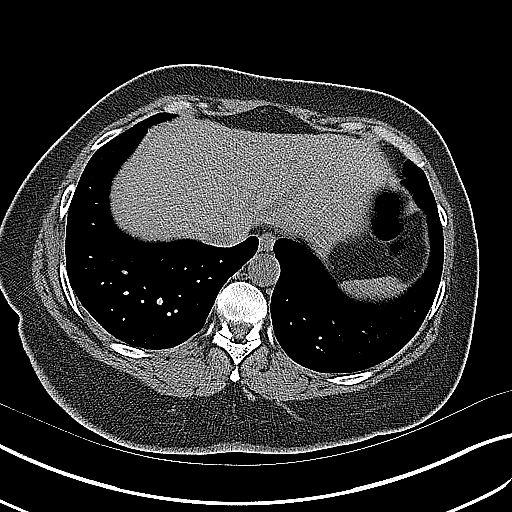
[im 33/54  bone]
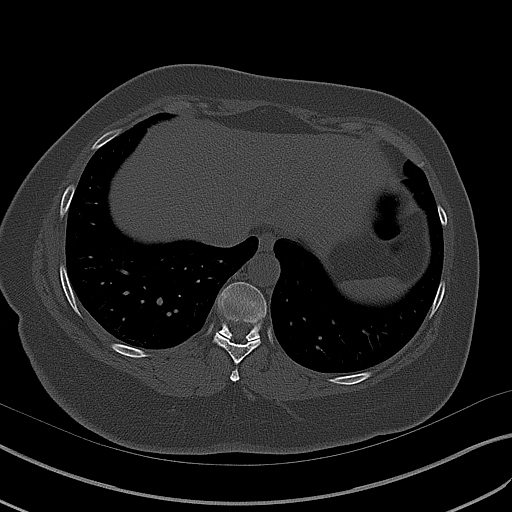
[im 36/54  soft-tissue]
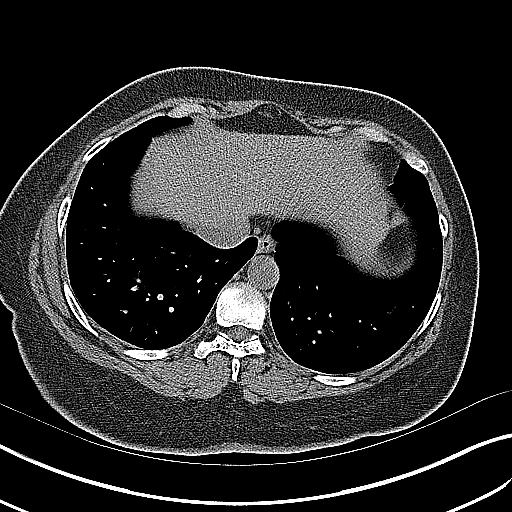
[im 40/54  soft-tissue]
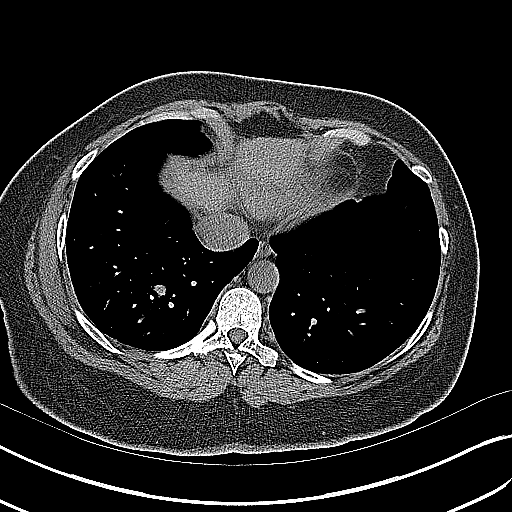
[im 43/54  soft-tissue]
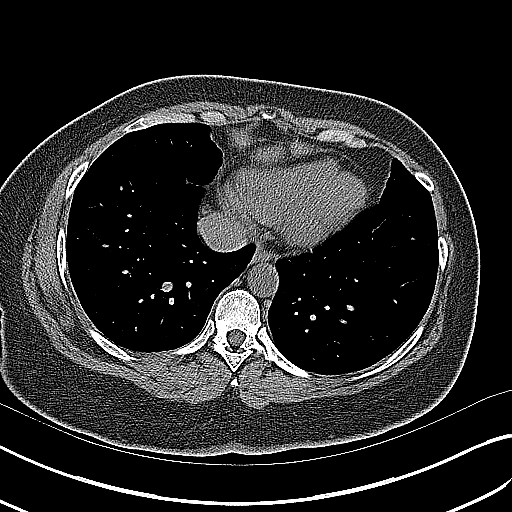
[im 47/54  soft-tissue]
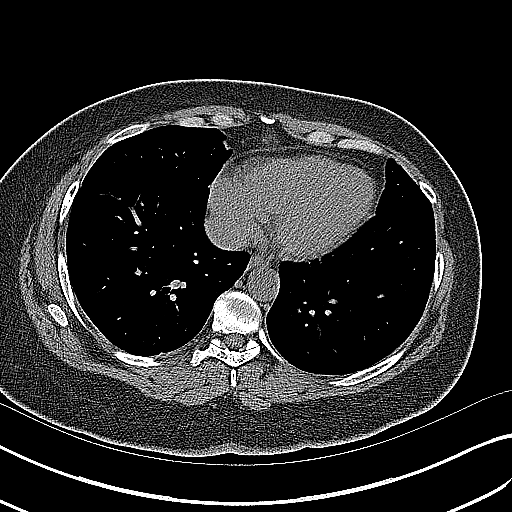
[im 50/54  soft-tissue]
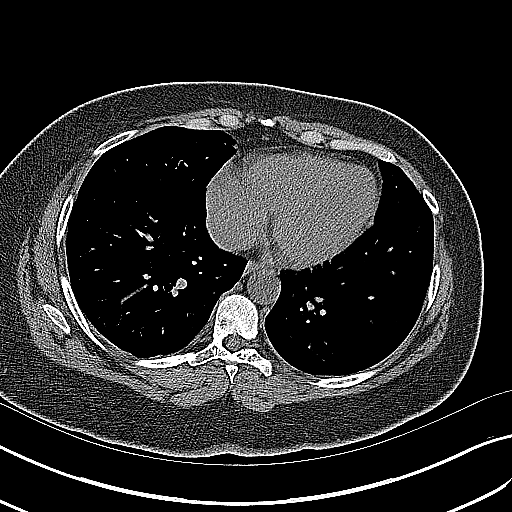

[Series 5: coronal · coronal · 0.80mm/px · 3 of 148 slices shown]
[im 50/148  soft-tissue]
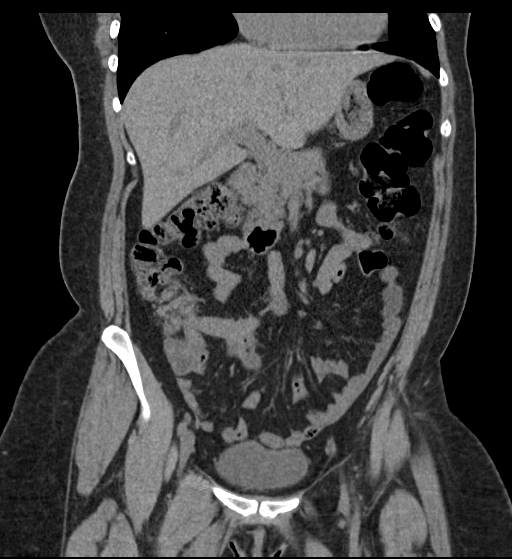
[im 66/148  soft-tissue]
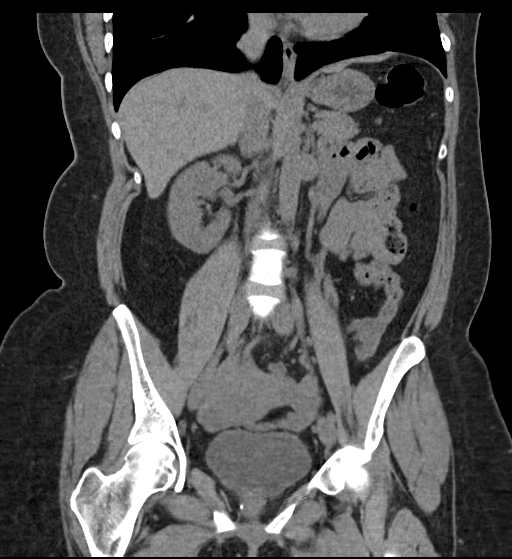
[im 82/148  soft-tissue]
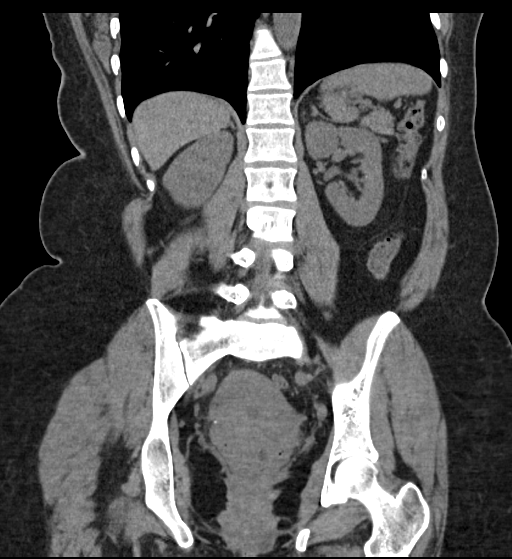

[17 of 46 positions shown; findings below may reference images not displayed]

FINDINGS: Lower chest: No acute abnormality.

Hepatobiliary: No focal hepatic abnormality. Gallbladder
unremarkable.

Pancreas: No focal abnormality or ductal dilatation.

Spleen: No focal abnormality.  Normal size.

Adrenals/Urinary Tract: Punctate nonobstructing stones in the lower
pole of the right kidney. No stones on the left. No hydronephrosis.
Urinary bladder and adrenal glands unremarkable.

Stomach/Bowel: Normal appendix. Stomach, large and small bowel
grossly unremarkable.

Vascular/Lymphatic: No evidence of aneurysm or adenopathy.

Reproductive: Uterus and adnexa unremarkable.  No mass.

Other: No free fluid or free air.

Musculoskeletal: No acute bony abnormality.
IMPRESSION: Punctate nephrolithiasis in the right lower pole. No renal or
ureteral stones on the left. No hydronephrosis.

## 2018-11-05 LAB — URINE CYTOLOGY ANCILLARY ONLY
CHLAMYDIA, DNA PROBE: NEGATIVE
Candida vaginitis: NEGATIVE
Neisseria Gonorrhea: NEGATIVE
Trichomonas: NEGATIVE

## 2018-12-01 ENCOUNTER — Other Ambulatory Visit: Payer: Self-pay

## 2018-12-02 ENCOUNTER — Encounter: Payer: Self-pay | Admitting: Obstetrics & Gynecology

## 2018-12-02 ENCOUNTER — Ambulatory Visit (INDEPENDENT_AMBULATORY_CARE_PROVIDER_SITE_OTHER): Payer: Commercial Managed Care - PPO | Admitting: Obstetrics & Gynecology

## 2018-12-02 VITALS — BP 118/76

## 2018-12-02 DIAGNOSIS — N898 Other specified noninflammatory disorders of vagina: Secondary | ICD-10-CM | POA: Diagnosis not present

## 2018-12-02 LAB — WET PREP FOR TRICH, YEAST, CLUE

## 2018-12-02 MED ORDER — FLUCONAZOLE 150 MG PO TABS: 150.0000 mg | ORAL_TABLET | Freq: Every day | ORAL | 3 refills | Status: AC

## 2018-12-02 MED ORDER — TINIDAZOLE 500 MG PO TABS: 2.0000 g | ORAL_TABLET | Freq: Every day | ORAL | 3 refills | Status: AC

## 2018-12-02 NOTE — Patient Instructions (Signed)
1. Vaginal odor Recurrence of bacterial vaginosis confirmed by wet prep.  Will rule out gonorrhea and chlamydia, pending cultures.  Treat bacterial vaginosis with tinidazole x2 days.  Usage reviewed and prescription sent to pharmacy.  Tends to develop a yeast vaginitis after antibiotic treatment, fluconazole 150 mg 1 tablet daily for 3 days prescribed.  Refills of both sent to pharmacy, patient will retreat if recurrence.  Recommend probiotic to be continued 1 tablet vaginally weekly. - WET PREP FOR [place], YEAST, CLUE - C. trachomatis/N. gonorrhoeae RNA  Other orders - tinidazole (TINDAMAX) 500 MG tablet; Take 4 tablets (2,000 mg total) by mouth daily for 2 days. - fluconazole (DIFLUCAN) 150 MG tablet; Take 1 tablet (150 mg total) by mouth daily for 3 days.  Teresa Lynch, it was a pleasure seeing you today!  I will inform you of your results as soon as they are available.

## 2018-12-02 NOTE — Progress Notes (Signed)
    Teresa Lynch 1971-07-10 [id number]        48 y.o.  G5P5L5 Stable boyfriend.  S/P TL.  RP: Vaginal odor with discharge  HPI: Last BV treated was 09/01/2018.  Abstinent since then.  Complains of vaginal discharge with odor.  No pelvic pain.  Urine and bowel movements normal.  Status post bilateral tubal sterilization and endometrial ablation, no menstrual period.   OB History  Gravida Para Term Preterm AB Living  5 5       5   SAB TAB Ectopic Multiple Live Births               # Outcome Date GA Lbr Len/2nd Weight Sex Delivery Anes PTL Lv  5 Para           4 Para           3 Para           2 Para           1 Para             Past medical history,surgical history, problem list, medications, allergies, family history and social history were all reviewed and documented in the EPIC chart.   Directed ROS with pertinent positives and negatives documented in the history of present illness/assessment and plan.  Exam:  Vitals:   12/02/18 1124  BP: 118/76   General appearance:  Normal  Abdomen: Normal  Gynecologic exam: Vulva normal.  Speculum: Cervix and vagina normal.  Increased vaginal discharge.  Wet prep done.  Gonorrhea and chlamydia done on cervix.  Wet prep: Clue cells present.   Assessment/Plan:  48 y.o. G5P5    1. Vaginal odor Recurrence of bacterial vaginosis confirmed by wet prep.  Will rule out gonorrhea and chlamydia, pending cultures.  Treat bacterial vaginosis with tinidazole x2 days.  Usage reviewed and prescription sent to pharmacy.  Tends to develop a yeast vaginitis after antibiotic treatment, fluconazole 150 mg 1 tablet daily for 3 days prescribed.  Refills of both sent to pharmacy, patient will retreat if recurrence.  Recommend probiotic to be continued 1 tablet vaginally weekly. - WET PREP FOR [place], YEAST, CLUE - C. trachomatis/N. gonorrhoeae RNA  Other orders - tinidazole (TINDAMAX) 500 MG tablet; Take 4 tablets (2,000 mg total) by mouth daily for  2 days. - fluconazole (DIFLUCAN) 150 MG tablet; Take 1 tablet (150 mg total) by mouth daily for 3 days.  Counseling on above issues and coordination of care more than 50% for 15 minutes.  Princess Bruins MD, 11:34 AM 12/02/2018

## 2018-12-03 LAB — C. TRACHOMATIS/N. GONORRHOEAE RNA
C. trachomatis RNA, TMA: NOT DETECTED
N. gonorrhoeae RNA, TMA: NOT DETECTED

## 2018-12-12 DIAGNOSIS — R300 Dysuria: Secondary | ICD-10-CM | POA: Diagnosis not present

## 2018-12-12 DIAGNOSIS — Z3202 Encounter for pregnancy test, result negative: Secondary | ICD-10-CM | POA: Diagnosis not present

## 2018-12-12 DIAGNOSIS — N200 Calculus of kidney: Secondary | ICD-10-CM | POA: Diagnosis not present

## 2018-12-29 ENCOUNTER — Other Ambulatory Visit: Payer: Self-pay

## 2018-12-30 ENCOUNTER — Ambulatory Visit (INDEPENDENT_AMBULATORY_CARE_PROVIDER_SITE_OTHER): Payer: Commercial Managed Care - PPO | Admitting: Obstetrics & Gynecology

## 2018-12-30 ENCOUNTER — Encounter: Payer: Self-pay | Admitting: Obstetrics & Gynecology

## 2018-12-30 VITALS — BP 128/86 | Ht 65.0 in | Wt 183.0 lb

## 2018-12-30 DIAGNOSIS — Z01419 Encounter for gynecological examination (general) (routine) without abnormal findings: Secondary | ICD-10-CM

## 2018-12-30 DIAGNOSIS — Z9851 Tubal ligation status: Secondary | ICD-10-CM | POA: Diagnosis not present

## 2018-12-30 DIAGNOSIS — E6609 Other obesity due to excess calories: Secondary | ICD-10-CM

## 2018-12-30 DIAGNOSIS — Z6830 Body mass index (BMI) 30.0-30.9, adult: Secondary | ICD-10-CM

## 2018-12-30 NOTE — Progress Notes (Signed)
Teresa Lynch Jun 11, 1971 [id number]   History:    48 y.o. G5P5L5 Stable boyfriend.  S/P TL.  RP:  Established patient presenting for annual gyn exam   HPI: S/P Endometrial Ablation.  No vaginal bleeding since ablation.  No menopausal symptoms.  No pelvic pain.  No pain with intercourse.  Normal vaginal secretions.  Status post tubal ligation.  Breasts normal.  Body mass index 30.45.  Lost 7 pounds since last year.  On a lower calorie/carb diet and increasing her physical activities.  Health labs with family physician.  Past medical history,surgical history, family history and social history were all reviewed and documented in the EPIC chart.  Gynecologic History No LMP recorded. Patient has had an ablation. Contraception: tubal ligation Last Pap: 12/2017. Results were: Negative, HPV HR neg.  Gono-Chlam neg. Last mammogram: 10/2015. Results were: Negative Bone Density: Never Colonoscopy: Never  Obstetric History OB History  Gravida Para Term Preterm AB Living  5 5       5   SAB TAB Ectopic Multiple Live Births               # Outcome Date GA Lbr Len/2nd Weight Sex Delivery Anes PTL Lv  5 Para           4 Para           3 Para           2 Para           1 Para              ROS: A ROS was performed and pertinent positives and negatives are included in the history.  GENERAL: No fevers or chills. HEENT: No change in vision, no earache, sore throat or sinus congestion. NECK: No pain or stiffness. CARDIOVASCULAR: No chest pain or pressure. No palpitations. PULMONARY: No shortness of breath, cough or wheeze. GASTROINTESTINAL: No abdominal pain, nausea, vomiting or diarrhea, melena or bright red blood per rectum. GENITOURINARY: No urinary frequency, urgency, hesitancy or dysuria. MUSCULOSKELETAL: No joint or muscle pain, no back pain, no recent trauma. DERMATOLOGIC: No rash, no itching, no lesions. ENDOCRINE: No polyuria, polydipsia, no heat or cold intolerance. No recent change in  weight. HEMATOLOGICAL: No anemia or easy bruising or bleeding. NEUROLOGIC: No headache, seizures, numbness, tingling or weakness. PSYCHIATRIC: No depression, no loss of interest in normal activity or change in sleep pattern.     Exam:   BP 128/86   Ht 5\' 5"  (1.651 m)   Wt 183 lb (83 kg)   BMI 30.45 kg/m   Body mass index is 30.45 kg/m.  General appearance : Well developed well nourished female. No acute distress HEENT: Eyes: no retinal hemorrhage or exudates,  Neck supple, trachea midline, no carotid bruits, no thyroidmegaly Lungs: Clear to auscultation, no rhonchi or wheezes, or rib retractions  Heart: Regular rate and rhythm, no murmurs or gallops Breast:Examined in sitting and supine position were symmetrical in appearance, no palpable masses or tenderness,  no skin retraction, no nipple inversion, no nipple discharge, no skin discoloration, no axillary or supraclavicular lymphadenopathy Abdomen: no palpable masses or tenderness, no rebound or guarding Extremities: no edema or skin discoloration or tenderness  Pelvic: Vulva: Normal             Vagina: No gross lesions or discharge  Cervix: No gross lesions or discharge  Uterus AV, normal size, shape and consistency, non-tender and mobile  Adnexa  Without masses or tenderness  Anus:  Normal   Assessment/Plan:  48 y.o. female for annual exam   1. Well female exam with routine gynecological exam Normal gynecologic exam.  Pap test in May 2019 was negative with negative high-risk HPV, no indication to repeat this year.  Breast exam normal.  Patient will schedule screening mammogram now.  Health labs with family physician.  2. S/P tubal ligation  3. Class 1 obesity due to excess calories without serious comorbidity with body mass index (BMI) of 30.0 to 30.9 in adult Lost 7 Lbs x last year.  Body mass index currently 30.45, good muscle mass.  Will continue on a lower calorie/carb diet such as [employer] and aerobic physical  activities 5 times a week with weightlifting every 2 days.  Princess Bruins MD, 10:19 AM 12/30/2018

## 2018-12-31 ENCOUNTER — Encounter: Payer: Self-pay | Admitting: Obstetrics & Gynecology

## 2018-12-31 NOTE — Patient Instructions (Signed)
1. Well female exam with routine gynecological exam Normal gynecologic exam.  Pap test in May 2019 was negative with negative high-risk HPV, no indication to repeat this year.  Breast exam normal.  Patient will schedule screening mammogram now.  Health labs with family physician.  2. S/P tubal ligation  3. Class 1 obesity due to excess calories without serious comorbidity with body mass index (BMI) of 30.0 to 30.9 in adult Lost 7 Lbs x last year.  Body mass index currently 30.45, good muscle mass.  Will continue on a lower calorie/carb diet such as [employer] and aerobic physical activities 5 times a week with weightlifting every 2 days.  Teresa Lynch, it was a pleasure seeing you today!

## 2019-06-03 ENCOUNTER — Other Ambulatory Visit: Payer: Self-pay | Admitting: Obstetrics & Gynecology

## 2019-06-03 DIAGNOSIS — Z1231 Encounter for screening mammogram for malignant neoplasm of breast: Secondary | ICD-10-CM

## 2019-07-20 ENCOUNTER — Ambulatory Visit: Payer: Commercial Managed Care - PPO

## 2020-01-02 ENCOUNTER — Encounter: Payer: Commercial Managed Care - PPO | Admitting: Obstetrics & Gynecology

## 2020-04-04 ENCOUNTER — Other Ambulatory Visit (HOSPITAL_COMMUNITY): Payer: Self-pay | Admitting: Obstetrics and Gynecology

## 2020-04-04 ENCOUNTER — Other Ambulatory Visit: Payer: Self-pay | Admitting: Obstetrics and Gynecology

## 2020-04-04 DIAGNOSIS — R109 Unspecified abdominal pain: Secondary | ICD-10-CM

## 2020-04-05 ENCOUNTER — Ambulatory Visit (HOSPITAL_COMMUNITY): Payer: Commercial Managed Care - PPO

## 2020-04-17 ENCOUNTER — Other Ambulatory Visit: Payer: Self-pay

## 2020-04-17 ENCOUNTER — Ambulatory Visit (HOSPITAL_COMMUNITY)
Admission: RE | Admit: 2020-04-17 | Discharge: 2020-04-17 | Disposition: A | Discharge status: Home / Self Care | Payer: Commercial Managed Care - PPO | Source: Ambulatory Visit | Attending: Obstetrics and Gynecology | Admitting: Obstetrics and Gynecology

## 2020-04-17 DIAGNOSIS — R109 Unspecified abdominal pain: Secondary | ICD-10-CM | POA: Diagnosis present

## 2020-05-22 ENCOUNTER — Emergency Department
Admission: EM | Admit: 2020-05-22 | Discharge: 2020-05-22 | Disposition: A | Discharge status: Home / Self Care | Payer: 59 | Attending: Emergency Medicine | Admitting: Emergency Medicine

## 2020-05-22 ENCOUNTER — Emergency Department: Payer: 59

## 2020-05-22 DIAGNOSIS — Z87442 Personal history of urinary calculi: Secondary | ICD-10-CM | POA: Insufficient documentation

## 2020-05-22 DIAGNOSIS — R109 Unspecified abdominal pain: Secondary | ICD-10-CM

## 2020-05-22 DIAGNOSIS — N200 Calculus of kidney: Secondary | ICD-10-CM | POA: Insufficient documentation

## 2020-05-22 DIAGNOSIS — K2970 Gastritis, unspecified, without bleeding: Secondary | ICD-10-CM | POA: Insufficient documentation

## 2020-05-22 LAB — URINALYSIS REFLEX TO MICROSCOPIC EXAM - REFLEX TO CULTURE
Bilirubin, UA: NEGATIVE
Glucose, UA: NEGATIVE
Ketones UA: NEGATIVE
Leukocyte Esterase, UA: NEGATIVE
Nitrite, UA: NEGATIVE
Protein, UR: NEGATIVE
Specific Gravity UA: 1.015 (ref 1.001–1.035)
Urine pH: 6 (ref 5.0–8.0)
Urobilinogen, UA: 0.2 mg/dL (ref 0.2–2.0)
WBC, UA: NONE SEEN /hpf (ref 0–5)

## 2020-05-22 LAB — COMPREHENSIVE METABOLIC PANEL
ALT: 8 U/L (ref 0–55)
AST (SGOT): 11 U/L (ref 5–34)
Albumin/Globulin Ratio: 1.3 (ref 0.9–2.2)
Albumin: 4 g/dL (ref 3.5–5.0)
Alkaline Phosphatase: 54 U/L (ref 37–106)
Anion Gap: 10 (ref 5.0–15.0)
BUN: 7 mg/dL (ref 7.0–19.0)
Bilirubin, Total: 0.9 mg/dL (ref 0.2–1.2)
CO2: 24 mEq/L (ref 22–29)
Calcium: 8.7 mg/dL (ref 8.5–10.5)
Chloride: 107 mEq/L (ref 100–111)
Creatinine: 0.7 mg/dL (ref 0.6–1.0)
Globulin: 3.2 g/dL (ref 2.0–3.6)
Glucose: 102 mg/dL — ABNORMAL HIGH (ref 70–100)
Potassium: 3.4 mEq/L — ABNORMAL LOW (ref 3.5–5.1)
Protein, Total: 7.2 g/dL (ref 6.0–8.3)
Sodium: 141 mEq/L (ref 136–145)

## 2020-05-22 LAB — CBC AND DIFFERENTIAL
Absolute NRBC: 0 10*3/uL (ref 0.00–0.00)
Basophils Absolute Automated: 0.02 10*3/uL (ref 0.00–0.08)
Basophils Automated: 0.4 %
Eosinophils Absolute Automated: 0.14 10*3/uL (ref 0.00–0.44)
Eosinophils Automated: 2.7 %
Hematocrit: 36.1 % (ref 34.7–43.7)
Hgb: 11.7 g/dL (ref 11.4–14.8)
Immature Granulocytes Absolute: 0 10*3/uL (ref 0.00–0.07)
Immature Granulocytes: 0 %
Lymphocytes Absolute Automated: 2.21 10*3/uL (ref 0.42–3.22)
Lymphocytes Automated: 42.9 %
MCH: 30.3 pg (ref 25.1–33.5)
MCHC: 32.4 g/dL (ref 31.5–35.8)
MCV: 93.5 fL (ref 78.0–96.0)
MPV: 10.3 fL (ref 8.9–12.5)
Monocytes Absolute Automated: 0.34 10*3/uL (ref 0.21–0.85)
Monocytes: 6.6 %
Neutrophils Absolute: 2.44 10*3/uL (ref 1.10–6.33)
Neutrophils: 47.4 %
Nucleated RBC: 0 /100 WBC (ref 0.0–0.0)
Platelets: 262 10*3/uL (ref 142–346)
RBC: 3.86 10*6/uL — ABNORMAL LOW (ref 3.90–5.10)
RDW: 13 % (ref 11–15)
WBC: 5.15 10*3/uL (ref 3.10–9.50)

## 2020-05-22 LAB — LIPASE: Lipase: 64 U/L (ref 8–78)

## 2020-05-22 LAB — URINE HCG QUALITATIVE: Urine HCG Qualitative: NEGATIVE

## 2020-05-22 LAB — GFR: EGFR: >60

## 2020-05-22 MED ORDER — TAMSULOSIN HCL 0.4 MG PO CAPS
0.4000 mg | ORAL_CAPSULE | Freq: Every day | ORAL | 0 refills | Status: AC
Start: 2020-05-22 — End: 2020-05-29

## 2020-05-22 MED ORDER — MELOXICAM 7.5 MG PO TABS
7.5000 mg | ORAL_TABLET | Freq: Every day | ORAL | 0 refills | Status: AC
Start: 2020-05-22 — End: ?

## 2020-05-22 MED ORDER — OXYCODONE-ACETAMINOPHEN 5-325 MG PO TABS
1.0000 | ORAL_TABLET | ORAL | 0 refills | Status: DC | PRN
Start: 2020-05-22 — End: 2020-05-22

## 2020-05-22 MED ORDER — FAMOTIDINE 10 MG/ML IV SOLN (WRAP)
20.0000 mg | Freq: Once | INTRAVENOUS | Status: AC
Start: 2020-05-22 — End: 2020-05-22
  Administered 2020-05-22: 19:00:00 20 mg via INTRAVENOUS

## 2020-05-22 MED ORDER — TAMSULOSIN HCL 0.4 MG PO CAPS
0.4000 mg | ORAL_CAPSULE | Freq: Every day | ORAL | 0 refills | Status: DC
Start: 2020-05-22 — End: 2020-05-22

## 2020-05-22 MED ORDER — MORPHINE SULFATE 4 MG/ML IJ/IV SOLN (WRAP)
4.0000 mg | Freq: Once | Status: AC
Start: 2020-05-22 — End: 2020-05-22
  Administered 2020-05-22: 19:00:00 4 mg via INTRAVENOUS

## 2020-05-22 MED ORDER — MELOXICAM 7.5 MG PO TABS
7.5000 mg | ORAL_TABLET | Freq: Every day | ORAL | 0 refills | Status: DC
Start: 2020-05-22 — End: 2020-05-22

## 2020-05-22 MED ORDER — IOHEXOL 350 MG/ML IV SOLN
100.0000 mL | Freq: Once | INTRAVENOUS | Status: AC | PRN
Start: 2020-05-22 — End: 2020-05-22
  Administered 2020-05-22: 100 mL via INTRAVENOUS

## 2020-05-22 MED ORDER — FAMOTIDINE 20 MG PO TABS
20.0000 mg | ORAL_TABLET | Freq: Two times a day (BID) | ORAL | 0 refills | Status: AC
Start: 2020-05-22 — End: 2020-05-27

## 2020-05-22 MED ORDER — ONDANSETRON 4 MG PO TBDP
4.0000 mg | ORAL_TABLET | Freq: Four times a day (QID) | ORAL | 0 refills | Status: AC | PRN
Start: 2020-05-22 — End: ?

## 2020-05-22 MED ORDER — TAMSULOSIN HCL 0.4 MG PO CAPS
0.4000 mg | ORAL_CAPSULE | Freq: Once | ORAL | Status: AC
Start: 2020-05-22 — End: 2020-05-22
  Administered 2020-05-22: 22:00:00 0.4 mg via ORAL

## 2020-05-22 MED ORDER — OXYCODONE-ACETAMINOPHEN 5-325 MG PO TABS
1.0000 | ORAL_TABLET | ORAL | 0 refills | Status: AC | PRN
Start: 2020-05-22 — End: 2020-05-29

## 2020-05-22 MED ORDER — ALUM & MAG HYDROXIDE-SIMETH 200-200-20 MG/5ML PO SUSP
20.0000 mL | Freq: Once | ORAL | Status: AC
Start: 2020-05-22 — End: 2020-05-22
  Administered 2020-05-22: 19:00:00 20 mL via ORAL

## 2020-05-22 MED ORDER — ONDANSETRON HCL 4 MG/2ML IJ SOLN
4.0000 mg | Freq: Once | INTRAMUSCULAR | Status: AC
Start: 2020-05-22 — End: 2020-05-22
  Administered 2020-05-22: 22:00:00 4 mg via INTRAVENOUS

## 2020-05-22 MED ORDER — ONDANSETRON 4 MG PO TBDP
4.0000 mg | ORAL_TABLET | Freq: Four times a day (QID) | ORAL | 0 refills | Status: DC | PRN
Start: 2020-05-22 — End: 2020-05-22

## 2020-05-22 MED ORDER — FAMOTIDINE 20 MG PO TABS
20.0000 mg | ORAL_TABLET | Freq: Two times a day (BID) | ORAL | 0 refills | Status: DC
Start: 2020-05-22 — End: 2020-05-22

## 2020-05-22 MED ORDER — LIDOCAINE VISCOUS HCL 2 % MT SOLN
10.0000 mL | Freq: Once | OROMUCOSAL | Status: AC
Start: 2020-05-22 — End: 2020-05-22
  Administered 2020-05-22: 19:00:00 10 mL via OROMUCOSAL

## 2020-05-22 NOTE — Discharge Instructions (Signed)
Follow up with OB when you return home.

## 2020-05-22 NOTE — ED Triage Notes (Signed)
Pt c/o LLQ abdominal pain. Pt states she has been having alternating pain to her LLQ and RLQ for about two months. Pt denies fever/chills/dysuria/hematuria/constipation/diarrhea/nausea/vomiting/vaginal bleeding.  Hx of kidney stones, this does not feel like a kidney stone.

## 2020-05-24 NOTE — ED Provider Notes (Signed)
EMERGENCY DEPARTMENT NOTE     Patient initially seen and examined at   ED PHYSICIAN ASSIGNED     None         ED MIDLEVEL (APP) ASSIGNED     Date/Time Event User Comments    05/22/20 1651 PA/NP Provider Assigned Laural Benes, Sophronia Simas, NP assigned as Nurse Practitioner          HISTORY OF PRESENT ILLNESS   Historian:Patient  Translator Used: No    Chief Complaint: Abdominal Pain     Mechanism of Injury:       49 y.o. female pmh migraines kidney stones. Traveling from out of town  1. Location of symptoms: LLQ, RLQ     2. Onset of symptoms: 2 months  3. What was patient doing when symptoms started (Context): see above  4. Severity: moderate  5. Timing: intermittent  6. Activities that worsen symptoms: unknown  7. Activities that improve symptoms: unknown  8. Quality: cramping  9. Radiation of symptoms: no  10. Associated signs and Symptoms: see above  11. Are symptoms worsening? yes  MEDICAL HISTORY     Past Medical History:  Past Medical History:   Diagnosis Date    Migraines        Past Surgical History:  History reviewed. No pertinent surgical history.    Social History:  Social History     Socioeconomic History    Marital status: Single     Spouse name: Not on file    Number of children: Not on file    Years of education: Not on file    Highest education level: Not on file   Occupational History    Not on file   Tobacco Use    Smoking status: Never Smoker    Smokeless tobacco: Never Used   Substance and Sexual Activity    Alcohol use: Not on file    Drug use: Not on file    Sexual activity: Not on file   Other Topics Concern    Not on file   Social History Narrative    Not on file     Social Determinants of Health     Financial Resource Strain:     Difficulty of Paying Living Expenses:    Food Insecurity:     Worried About Running Out of Food in the Last Year:     Barista in the Last Year:    Transportation Needs:     Freight forwarder (Medical):     Lack of Transportation  (Non-Medical):    Physical Activity:     Days of Exercise per Week:     Minutes of Exercise per Session:    Stress:     Feeling of Stress :    Social Connections:     Frequency of Communication with Friends and Family:     Frequency of Social Gatherings with Friends and Family:     Attends Religious Services:     Active Member of Clubs or Organizations:     Attends Engineer, structural:     Marital Status:    Intimate Partner Violence:     Fear of Current or Ex-Partner:     Emotionally Abused:     Physically Abused:     Sexually Abused:        Family History:  History reviewed. No pertinent family history.    Outpatient Medication:  Discharge Medication List as of 05/22/2020  9:12 PM  REVIEW OF SYSTEMS   Review of Systems   Constitutional: Negative for fatigue and fever.   HENT: Negative for congestion.    Respiratory: Negative for cough and shortness of breath.    Cardiovascular: Negative for chest pain and palpitations.   Gastrointestinal: Positive for abdominal pain. Negative for nausea and vomiting.   Genitourinary: Negative for dysuria and flank pain.   Neurological: Negative for dizziness and headaches.   All other systems reviewed and are negative.             PHYSICAL EXAM     ED Triage Vitals   Enc Vitals Group      BP 05/22/20 1655 146/83      Heart Rate 05/22/20 1655 74      Resp Rate 05/22/20 1655 18      Temp 05/22/20 1703 98.3 F (36.8 C)      Temp Source 05/22/20 1655 Oral      SpO2 05/22/20 1655 98 %      Weight --       Height --       Head Circumference --       Peak Flow --       Pain Score 05/22/20 1655 10      Pain Loc --       Pain Edu? --       Excl. in GC? --      Nursing note and vitals reviewed.  Constitutional:  Well developed, well nourished.  Awake & alert.    Head:  Atraumatic.  Normocephalic.    ENT:  Mucous membranes are moist and intact.  Patent airway.  Neck:  Supple.  No JVD.   Cardiovascular:  Regular rate.  Regular rhythm.   Pulmonary/Chest:  No  evidence of respiratory distress.  Clear to auscultation bilaterally.  No wheezing, rales or rhonchi.   Abdominal:  Soft and non-distended.  There is no tenderness.  No rebound, guarding, or rigidity.  No bruit. No masses palpable  Back:  No CVA tenderness.   Extremities:  No edema.   No cyanosis.  No clubbing.  2+ DP/PT/radial pulses b/l.  Skin:  Skin is warm and dry.  No diaphoresis.    Neurological:  Alert, awake, and appropriate.  Normal speech.  Moves all extremities.  Normal gate.  Psychiatric:  Good eye contact.  Normal interaction, affect, and behavior        MEDICAL DECISION MAKING     DISCUSSION    CBC to assess for anemia and infection.   CMP to assess for electrolyte abnormalities and kidney function  Lipase assess for pancreatitis   UA r/o infection and assess for hematuria  IVF for hydration  Urine Preg- negative  Ct ABD pelvis to r/o appy/diverticulitis     Patient presenting for evaluation of RLQ abdominal pain, LLQ abdominal pain, epigastric abdominal pain.    Vital signs revealed no major abnormalities.    Diagnosis at this time is most consistent with gastritis, nephrolithiasis, generalized abdominal pain.    Differential diagnosis considered including GERD, diverticulitis, generalized abdominal pain, gyn related pain.    Diagnostic testing performed: WBC normal, metabolic panel normal, urinalysis does not show infection, CT of the abdomen shows no acute findings.    Treatments provided included normal saline, morphine, toradol, with improvement in symptoms.    return to clinic or urgent care with new or worsening symptoms, present to ER with new or worsening symptoms.    I was present for the  bedside discharge alongside the patient's ED nurse. Course of visit in the ED and discharge instructions were reviewed with patient and they were given the opportunity to ask any questions regarding their care today. Red Flags for Return discussed.  Patient and/ or patient's family verbalized understanding  of, and comfort with, instructions and plan.      All labs and vital signs from the current visit have been reviewed and any abnormality that is present is not due to severe sepsis or septic shock.    Vital Signs: Reviewed the patients vital signs.   Nursing Notes: Reviewed and utilized available nursing notes.  Medical Records Reviewed: Reviewed available past medical records.  Counseling: The emergency provider has spoken with the patient and discussed todays findings, in addition to providing specific details for the plan of care.  Questions are answered and there is agreement with the plan.      MIPS DOCUMENTATION        CARDIAC STUDIES    The following cardiac studies were independently interpreted by the Emergency Medicine Physician.  For full cardiac study results please see chart.    Monitor Strip  Interpreted by ED NP  Rate: 77  Rhythm: NSR   ST Changes: none        EMERGENCY IMAGING STUDIES    The following imagine studies were independently interpreted by me (emergency physician):      RADIOLOGY IMAGING STUDIES      CT Abd/Pelvis with IV Contrast   Final Result         1.  There is not evidence of diverticulitis.      2.  No free fluid or inflammatory changes are seen in the abdomen or   pelvis.      3.  There is a low-density area in the endometrium measuring   approximately 1.9 x 3.4 x 2.2 cm. Etiology is uncertain. This could be   further characterized sonographically.      4.  Punctate nonobstructing calcification is seen in the right kidney.                  Tana Felts, MD    05/22/2020 8:15 PM              PULSE OXIMETRY    Oxygen Saturation by Pulse Oximetry: 99%  Interventions: none  Interpretation:  WNL    EMERGENCY DEPT. MEDICATIONS      ED Medication Orders (From admission, onward)    Start Ordered     Status Ordering Provider    05/22/20 2150 05/22/20 2149  ondansetron (ZOFRAN) injection 4 mg  Once     Route: Intravenous  Ordered Dose: 4 mg     Last MAR action: Given Geralynn Rile     05/22/20 2112 05/22/20 2111  tamsulosin (FLOMAX) capsule 0.4 mg  Once     Route: Oral  Ordered Dose: 0.4 mg     Last MAR action: Given Geralynn Rile    05/22/20 1954 05/22/20 1954  iohexol (OMNIPAQUE) 350 MG/ML injection 100 mL  IMG once as needed     Route: Intravenous  Ordered Dose: 100 mL     Last MAR action: Imaging Agent Given Geralynn Rile    05/22/20 1803 05/22/20 1802  famotidine (PEPCID) injection 20 mg  Once     Route: Intravenous  Ordered Dose: 20 mg     Last MAR action: Given Geralynn Rile    05/22/20 1803 05/22/20 1802  lidocaine  viscous (XYLOCAINE) 2 % solution 10 mL  Once     Route: Mouth/Throat  Ordered Dose: 10 mL     Last MAR action: Given Geralynn Rile    05/22/20 1803 05/22/20 1802  alum & mag hydroxide-simethicone (MAALOX PLUS) 200-200-20 mg/5 mL suspension 20 mL  Once     Route: Oral  Ordered Dose: 20 mL     Last MAR action: Given Geralynn Rile    05/22/20 1803 05/22/20 1802  morphine injection 4 mg  Once     Route: Intravenous  Ordered Dose: 4 mg     Last MAR action: Given Terrace Chiem S          LABORATORY RESULTS    Ordered and independently interpreted AVAILABLE laboratory tests. Please see results section in chart for full details.  Results for orders placed or performed during the hospital encounter of 05/22/20   CBC and differential   Result Value Ref Range    WBC 5.15 3.1 - 9.5 x10 3/uL    Hgb 11.7 11.4 - 14.8 g/dL    Hematocrit 16.1 09.6 - 43.7 %    Platelets 262 142 - 346 x10 3/uL    RBC 3.86 (L) 3.90 - 5.10 x10 6/uL    MCV 93.5 78.0 - 96.0 fL    MCH 30.3 25.1 - 33.5 pg    MCHC 32.4 31 - 35 g/dL    RDW 13 04.5 - 40.9 %    MPV 10.3 8.9 - 12.5 fL    Neutrophils 47.4 None %    Lymphocytes Automated 42.9 None %    Monocytes 6.6 None %    Eosinophils Automated 2.7 None %    Basophils Automated 0.4 None %    Immature Granulocytes 0.0 None %    Nucleated RBC 0.0 0.0 - 0.0 /100 WBC    Neutrophils Absolute 2.44 1 - 6 x10 3/uL    Lymphocytes Absolute Automated 2.21 0.42 - 3.22 x10  3/uL    Monocytes Absolute Automated 0.34 0.21 - 0.85 x10 3/uL    Eosinophils Absolute Automated 0.14 0.00 - 0.44 x10 3/uL    Basophils Absolute Automated 0.02 0.00 - 0.08 x10 3/uL    Immature Granulocytes Absolute 0.00 0.00 - 0.07 x10 3/uL    Absolute NRBC 0.00 0.00 - 0.00 x10 3/uL   Comprehensive metabolic panel   Result Value Ref Range    Glucose 102 (H) 70 - 100 mg/dL    BUN 7.0 7 - 19 mg/dL    Creatinine 0.7 0.6 - 1.0 mg/dL    Sodium 811 914 - 782 mEq/L    Potassium 3.4 (L) 3.5 - 5.1 mEq/L    Chloride 107 100 - 111 mEq/L    CO2 24 22 - 29 mEq/L    Calcium 8.7 8.5 - 10.5 mg/dL    Protein, Total 7.2 6.0 - 8.3 g/dL    Albumin 4.0 3.5 - 5.0 g/dL    AST (SGOT) 11 5 - 34 U/L    ALT 8 0 - 55 U/L    Alkaline Phosphatase 54 37 - 106 U/L    Bilirubin, Total 0.9 0.2 - 1.2 mg/dL    Globulin 3.2 2.0 - 3.6 g/dL    Albumin/Globulin Ratio 1.3 0.9 - 2.2    Anion Gap 10.0 5 - 15   Lipase   Result Value Ref Range    Lipase 64 8 - 78 U/L   Urinalysis Reflex to Microscopic Exam- Reflex to Culture   Result  Value Ref Range    Urine Type Urine, Clean Ca     Color, UA YELLOW Colorless - Yellow    Clarity, UA CLEAR Clear - Hazy    Specific Gravity UA 1.015 1.001 - 1.035    Urine pH 6.0 5.0 - 8.0    Leukocyte Esterase, UA NEGATIVE Negative    Nitrite, UA NEGATIVE Negative    Protein, UR NEGATIVE Negative    Glucose, UA NEGATIVE Negative    Ketones UA NEGATIVE Negative    Urobilinogen, UA 0.2 0.2 - 2.0 mg/dL    Bilirubin, UA NEGATIVE Negative    Blood, UA TRACE-LYSED (A) Negative    RBC, UA 3 - 5 0 - 5 /hpf    WBC, UA None Seen 0 - 5 /hpf   Urine HCG, Qualitative   Result Value Ref Range    Urine HCG Qualitative Negative Negative   GFR   Result Value Ref Range    EGFR >60.0        CRITICAL CARE/PROCEDURES    Procedures      DIAGNOSIS      Diagnosis:  Final diagnoses:   Kidney stone   Abdominal pain, unspecified abdominal location   Gastritis without bleeding, unspecified chronicity, unspecified gastritis type       Disposition:  ED  Disposition     ED Disposition Condition Date/Time Comment    Discharge  Tue May 22, 2020  9:38 PM Kristin Phillips discharge to home/self care.    Condition at disposition: Stable            Prescriptions:  Discharge Medication List as of 05/22/2020  9:12 PM      START taking these medications    Details   famotidine (PEPCID) 20 MG tablet Take 1 tablet (20 mg total) by mouth 2 (two) times daily for 5 days, Starting Tue 05/22/2020, Until Sun 05/27/2020, E-Rx      meloxicam (MOBIC) 7.5 MG tablet Take 1 tablet (7.5 mg total) by mouth daily, Starting Tue 05/22/2020, E-Rx      oxyCODONE-acetaminophen (PERCOCET) 5-325 MG per tablet Take 1 tablet by mouth every 4 (four) hours as needed for Pain, Starting Tue 05/22/2020, Until Tue 05/29/2020 at 2359, E-Rx      tamsulosin (FLOMAX) 0.4 MG Cap Take 1 capsule (0.4 mg total) by mouth daily for 7 days Discontinue for lightheadedness, Starting Tue 05/22/2020, Until Tue 05/29/2020, E-Rx              Geralynn Rile, NP  05/27/20 0231

## 2020-08-20 ENCOUNTER — Other Ambulatory Visit: Payer: Self-pay | Admitting: Obstetrics and Gynecology

## 2020-08-20 DIAGNOSIS — R222 Localized swelling, mass and lump, trunk: Secondary | ICD-10-CM

## 2020-09-27 ENCOUNTER — Other Ambulatory Visit: Payer: Commercial Managed Care - PPO

## 2020-11-02 ENCOUNTER — Ambulatory Visit
Admission: RE | Admit: 2020-11-02 | Discharge: 2020-11-02 | Disposition: A | Discharge status: Home / Self Care | Payer: Commercial Managed Care - PPO | Source: Ambulatory Visit | Attending: Obstetrics and Gynecology | Admitting: Obstetrics and Gynecology

## 2020-11-02 ENCOUNTER — Other Ambulatory Visit: Payer: Self-pay

## 2020-11-02 ENCOUNTER — Other Ambulatory Visit: Payer: Self-pay | Admitting: Obstetrics and Gynecology

## 2020-11-02 DIAGNOSIS — R222 Localized swelling, mass and lump, trunk: Secondary | ICD-10-CM

## 2020-11-05 ENCOUNTER — Ambulatory Visit
Admission: RE | Admit: 2020-11-05 | Discharge: 2020-11-05 | Disposition: A | Discharge status: Home / Self Care | Payer: Commercial Managed Care - PPO | Source: Ambulatory Visit | Attending: Obstetrics and Gynecology | Admitting: Obstetrics and Gynecology

## 2020-11-05 ENCOUNTER — Other Ambulatory Visit: Payer: Self-pay | Admitting: Obstetrics and Gynecology

## 2020-11-05 ENCOUNTER — Other Ambulatory Visit: Payer: Self-pay

## 2020-11-05 DIAGNOSIS — R222 Localized swelling, mass and lump, trunk: Secondary | ICD-10-CM

## 2020-12-28 ENCOUNTER — Other Ambulatory Visit: Payer: Self-pay | Admitting: *Deleted

## 2020-12-28 ENCOUNTER — Ambulatory Visit (INDEPENDENT_AMBULATORY_CARE_PROVIDER_SITE_OTHER): Payer: Commercial Managed Care - PPO

## 2020-12-28 DIAGNOSIS — R002 Palpitations: Secondary | ICD-10-CM

## 2021-01-07 ENCOUNTER — Other Ambulatory Visit: Payer: Self-pay | Admitting: Family Medicine

## 2021-01-07 DIAGNOSIS — R002 Palpitations: Secondary | ICD-10-CM

## 2021-01-09 ENCOUNTER — Other Ambulatory Visit: Payer: Self-pay

## 2021-01-09 ENCOUNTER — Inpatient Hospital Stay: Payer: Commercial Managed Care - PPO

## 2021-01-09 DIAGNOSIS — R002 Palpitations: Secondary | ICD-10-CM

## 2021-03-14 ENCOUNTER — Other Ambulatory Visit: Payer: Self-pay | Admitting: Obstetrics and Gynecology

## 2021-04-10 ENCOUNTER — Other Ambulatory Visit: Payer: Self-pay

## 2021-04-10 ENCOUNTER — Emergency Department: Payer: No Typology Code available for payment source

## 2021-04-10 ENCOUNTER — Emergency Department
Admission: EM | Admit: 2021-04-10 | Discharge: 2021-04-10 | Disposition: A | Discharge status: Home / Self Care | Payer: No Typology Code available for payment source | Attending: Emergency Medicine | Admitting: Emergency Medicine

## 2021-04-10 ENCOUNTER — Encounter: Payer: Self-pay | Admitting: Hematology

## 2021-04-10 DIAGNOSIS — S39011A Strain of muscle, fascia and tendon of abdomen, initial encounter: Secondary | ICD-10-CM | POA: Diagnosis not present

## 2021-04-10 DIAGNOSIS — S79912A Unspecified injury of left hip, initial encounter: Secondary | ICD-10-CM | POA: Diagnosis present

## 2021-04-10 DIAGNOSIS — Y990 Civilian activity done for income or pay: Secondary | ICD-10-CM | POA: Insufficient documentation

## 2021-04-10 DIAGNOSIS — S76012A Strain of muscle, fascia and tendon of left hip, initial encounter: Secondary | ICD-10-CM | POA: Diagnosis not present

## 2021-04-10 DIAGNOSIS — X500XXA Overexertion from strenuous movement or load, initial encounter: Secondary | ICD-10-CM | POA: Insufficient documentation

## 2021-04-10 DIAGNOSIS — S76212A Strain of adductor muscle, fascia and tendon of left thigh, initial encounter: Secondary | ICD-10-CM

## 2021-04-10 MED ORDER — MELOXICAM 15 MG PO TABS: 15.0000 mg | ORAL_TABLET | Freq: Every day | ORAL | 0 refills | Status: DC

## 2021-04-10 MED ORDER — METHOCARBAMOL 500 MG PO TABS: 500.0000 mg | ORAL_TABLET | Freq: Three times a day (TID) | ORAL | 0 refills | Status: DC | PRN

## 2021-04-10 NOTE — ED Triage Notes (Signed)
Pt states she has been having left groin pain since lifting something heavy at work last Thursday. Pt is ambulatory to triage with NAD noted.

## 2021-04-10 NOTE — ED Provider Notes (Signed)
[facility] Emergency Department Provider Note ____________________________________________  Time seen: Approximately 7:36 PM  I have reviewed the triage vital signs and the nursing notes.   HISTORY  Chief Complaint Groin Pain    HPI Teresa Lynch is a 50 y.o. female who presents to the emergency department for evaluation and treatment of left hip and groin pain.  While at work 8 days ago, she lifted a monitor and turned quickly to put it on the conveyor belt and felt something pull.  She states that she has been trying to just work through it but the pain is not getting any better.  Some relief with ibuprofen but the pain returns when it wears off.  Past Medical History:  Diagnosis Date   Anemia    Headache    Migraines   Heart murmur    only when pregnant in 2001   Vaginal delivery 1991, 1993, 1994, 1998, 2001    Patient Active Problem List   Diagnosis Date Noted   Family history of breast cancer in female 05/30/2016   Iron deficiency anemia 05/23/2016   Paresthesia 04/16/2016    Past Surgical History:  Procedure Laterality Date   DILITATION & CURRETTAGE/HYSTROSCOPY WITH NOVASURE ABLATION N/A 10/18/2015   Procedure: DILATATION & CURETTAGE/HYSTEROSCOPY WITH NOVASURE ABLATION;  Surgeon: Princess Bruins, MD;  Location: [facility];  Service: Gynecology;  Laterality: N/A;   TUBAL LIGATION     WISDOM TOOTH EXTRACTION      Prior to Admission medications   Medication Sig Start Date End Date Taking? Authorizing Provider  meloxicam (MOBIC) 15 MG tablet Take 1 tablet (15 mg total) by mouth daily. 04/10/21  Yes Raevin Wierenga B, FNP  methocarbamol (ROBAXIN) 500 MG tablet Take 1 tablet (500 mg total) by mouth every 8 (eight) hours as needed for muscle spasms. 04/10/21  Yes Cleon Thoma B, FNP  baclofen (LIORESAL) 10 MG tablet Take 10 mg by mouth 3 (three) times daily.    [provider]  eletriptan (RELPAX) 40 MG tablet Take 40 mg by mouth as needed  for migraine or headache. May repeat in 2 hours if headache persists or recurs.    [provider]  Galcanezumab-gnlm (EMGALITY) 120 MG/ML SOSY Inject into the skin.    [provider]    Allergies Patient has no known allergies.  Family History  Problem Relation Age of Onset   Breast cancer Mother        d. 79   Sarcoidosis Maternal Aunt        d. 30s   Breast cancer Maternal Aunt        dx 37s or younger   Cirrhosis Maternal Uncle        +EtOH abuse; d. 80   Breast cancer Paternal Aunt        dx under 27y; d. 47y   Dementia Maternal Grandmother        d. 57   Breast cancer Maternal Grandmother        dx under age of 40   Heart attack Maternal Grandfather        d. 59s   Down syndrome Sister        d. 52   Cancer Maternal Uncle        d. 77s; unspecified type; smoker   Breast cancer Cousin 25       maternal 1st cousin; d. 75   Down syndrome Maternal Aunt        d. late 20s-30s  Social History Social History   Tobacco Use   Smoking status: Never   Smokeless tobacco: Never  Vaping Use   Vaping Use: Never used  Substance Use Topics   Alcohol use: Yes    Comment: social   Drug use: No    Review of Systems Constitutional: Negative for fever. Cardiovascular: Negative for chest pain. Respiratory: Negative for shortness of breath. Musculoskeletal: Positive for left groin and hip pain. Skin: Negative for open wounds or lesions. Neurological: Negative for decrease in sensation  ____________________________________________   PHYSICAL EXAM:  VITAL SIGNS: ED Triage Vitals [04/10/21 1703]  Enc Vitals Group     BP 131/86     Pulse Rate 74     Resp 17     Temp 98 F (36.7 C)     Temp Source Oral     SpO2 100 %     Weight      Height      Head Circumference      Peak Flow      Pain Score      Pain Loc      Pain Edu?      Excl. in [place]?     Constitutional: Alert and oriented. Well appearing and in no acute distress. Eyes:  Conjunctivae are clear without discharge or drainage Head: Atraumatic Neck: Supple. Respiratory: No cough. Respirations are even and unlabored. Musculoskeletal: Pain in the left groin and left hip increases with palpation over the area as well as movement in any direction.  Steady gait observed. Neurologic: Motor and sensory function is intact. Skin: No open wounds or lesions. Psychiatric: Affect and behavior are appropriate.  ____________________________________________   LABS (all labs ordered are listed, but only abnormal results are displayed)  Labs Reviewed - No data to display ____________________________________________  RADIOLOGY  Image of the left hip is negative for acute findings.  I, Analeia George, personally viewed and evaluated these images (plain radiographs) as part of my medical decision making, as well as reviewing the written report by the radiologist.  DG Hip Unilat W or Wo Pelvis 2-3 Views Left  Result Date: 04/10/2021 CLINICAL DATA:  Pain after lifting EXAM: DG HIP (WITH OR WITHOUT PELVIS) 2-3V LEFT COMPARISON:  None. FINDINGS: There is no evidence of hip fracture or dislocation. There is no evidence of arthropathy or other focal bone abnormality. IMPRESSION: Negative. Electronically Signed   By: Ulyses Jarred M.D.   On: 04/10/2021 20:02   ____________________________________________   PROCEDURES  Procedures  ____________________________________________   INITIAL IMPRESSION / ASSESSMENT AND PLAN / ED COURSE  Teresa Lynch is a 50 y.o. who presents to the emergency department for treatment and evaluation of left groin and hip pain.  See HPI for further details.  X-ray of the left hip and pelvis is negative for acute findings.  Plan will be to treat her with meloxicam and Robaxin.  She will be given a work note for today and tomorrow.  She will be encouraged to follow-up with orthopedics for symptoms that are not improving over the week.  She will be  encouraged to return to the emergency department for symptoms of change or worsen if she is unable to schedule an appointment with primary care or the specialist.  Medications - No data to display  Pertinent labs & imaging results that were available during my care of the patient were reviewed by me and considered in my medical decision making (see chart for details).   _________________________________________   FINAL  CLINICAL IMPRESSION(S) / ED DIAGNOSES  Final diagnoses:  Strain of groin, left, initial encounter  Muscle strain of left hip, initial encounter    ED Discharge Orders          Ordered    meloxicam (MOBIC) 15 MG tablet  Daily        04/10/21 2017    methocarbamol (ROBAXIN) 500 MG tablet  Every 8 hours PRN        04/10/21 2017             If controlled substance prescribed during this visit, 12 month history viewed on the [place] prior to issuing an initial prescription for Schedule II or III opiod.    Victorino Dike, FNP 04/10/21 2241    Delman Kitten, MD 04/16/21 [phone]

## 2021-04-10 NOTE — ED Notes (Signed)
Patient completing workers comp drug screen at this time.

## 2021-04-10 NOTE — ED Notes (Signed)
Pt reports that she was at work last Friday and felt something in her left hip pop, pt states that she turned to put a box on the conveyor belt that's when she felt the pop. Pt reports if she sits for long periods of time and tries to move she feels it pull and if she is standing for a long period of time she feels like her leg is giving way

## 2021-04-10 NOTE — Discharge Instructions (Addendum)
Please follow up with orthopedics if not improving over the week.  Return to the ER for symptoms that change or worsen if unable to schedule an appointment.

## 2021-04-12 ENCOUNTER — Encounter: Payer: Self-pay | Admitting: Hematology

## 2021-05-10 ENCOUNTER — Encounter (HOSPITAL_BASED_OUTPATIENT_CLINIC_OR_DEPARTMENT_OTHER): Payer: Self-pay | Admitting: Obstetrics and Gynecology

## 2021-05-10 ENCOUNTER — Other Ambulatory Visit: Payer: Self-pay

## 2021-05-10 DIAGNOSIS — Z20822 Contact with and (suspected) exposure to covid-19: Secondary | ICD-10-CM | POA: Diagnosis not present

## 2021-05-10 DIAGNOSIS — Z01812 Encounter for preprocedural laboratory examination: Secondary | ICD-10-CM | POA: Diagnosis not present

## 2021-05-10 NOTE — Progress Notes (Addendum)
YOU ARE SCHEDULED FOR A COVID TEST ON  05-21-2021   . THIS TEST MUST BE DONE BEFORE SURGERY. GO TO  [facility] [place]   PHONE NUMBER [phone].   TURN LEFT AT THE SHIPPING AND RECEIVING SIGN AND LOOK FOR SMALL BLUE POP UP TENT UNDER BUILDING OVERHANG AT BACK OF BUILDING AND REMAIN IN YOUR CAR, THIS IS A DRIVE UP TEST.  AFTER YOUR COVID TEST , PLEASE WEAR A MASK OUT IN PUBLIC AND SOCIAL DISTANCE AND [place] YOUR HANDS FREQUENTLY. PLEASE ASK ALL YOUR CLOSE HOUSEHOLD CONTACT TO WEAR MASK OUT IN PUBLIC AND SOCIAL DISTANCE AND [place] HANDS FREQUENTLY ALSO.      Your procedure is scheduled on 05-24-2021  Report to [facility]   Call this number if you have problems the morning of surgery  [phone].   OUR ADDRESS IS [address] [place].  WE ARE LOCATED IN [facility].  PLEASE BRING YOUR INSURANCE CARD AND PHOTO ID DAY OF SURGERY.  ONLY ONE PERSON ALLOWED IN FACILITY WAITING AREA.                                     REMEMBER:  DO NOT EAT FOOD, CANDY GUM OR MINTS  AFTER MIDNIGHT . YOU MAY HAVE CLEAR LIQUIDS UNTIL  430 AM. NO CLEAR LIQUIDS AFTER 430 AM DAY OF SURGERY.   YOU MAY  BRUSH YOUR TEETH MORNING OF SURGERY AND RINSE YOUR MOUTH OUT, NO CHEWING GUM CANDY OR MINTS.    CLEAR LIQUID DIET   Foods Allowed                                                                     Foods Excluded  Coffee and tea, regular and decaf                             liquids that you cannot  Plain Jell-O any favor except red or purple                                           see through such as: Fruit ices (not with fruit pulp)                                     milk, soups, orange juice  Iced Popsicles                                    All solid food Carbonated beverages, regular and diet                                    Cranberry, grape and apple juices Sports drinks like Gatorade Lightly seasoned clear  broth or  consume(fat free) Sugar  Sample Menu Breakfast                                Lunch                                     Supper Cranberry juice                    Beef broth                            Chicken broth Jell-O                                     Grape juice                           Apple juice Coffee or tea                        Jell-O                                      Popsicle                                                Coffee or tea                        Coffee or tea  _____________________________________________________________________     TAKE THESE MEDICATIONS MORNING OF SURGERY WITH A SIP OF WATER: NONE  ONE VISITOR IS ALLOWED IN WAITING ROOM ONLY DAY OF SURGERY.  NO VISITOR MAY SPEND THE NIGHT.  VISITOR ARE ALLOWED TO STAY UNTIL 800 PM.                                    DO NOT WEAR JEWERLY, MAKE UP. DO NOT WEAR LOTIONS, POWDERS, PERFUMES OR NAIL POLISH. DO NOT SHAVE FOR 48 HOURS PRIOR TO DAY OF SURGERY. MEN MAY SHAVE FACE AND NECK. CONTACTS, GLASSES, OR DENTURES MAY NOT BE WORN TO SURGERY.                                    [facility] IS NOT RESPONSIBLE  FOR ANY BELONGINGS.                                                                    Marland Kitchen           [facility] - Preparing for Surgery Before surgery, you can play an important role.  Because skin is not  sterile, your skin needs to be as free of germs as possible.  You can reduce the number of germs on your skin by washing with CHG (chlorahexidine gluconate) soap before surgery.  CHG is an antiseptic cleaner which kills germs and bonds with the skin to continue killing germs even after washing. Please DO NOT use if you have an allergy to CHG or antibacterial soaps.  If your skin becomes reddened/irritated stop using the CHG and inform your nurse when you arrive at Short Stay. Do not shave (including legs and underarms) for at least 48 hours prior to the first CHG shower.  You may shave your  face/neck. Please follow these instructions carefully:  1.  Shower with CHG Soap the night before surgery and the  morning of Surgery.  2.  If you choose to wash your hair, wash your hair first as usual with your  normal  shampoo.  3.  After you shampoo, rinse your hair and body thoroughly to remove the  shampoo.                            4.  Use CHG as you would any other liquid soap.  You can apply chg directly  to the skin and wash                      Gently with a scrungie or clean washcloth.  5.  Apply the CHG Soap to your body ONLY FROM THE NECK DOWN.   Do not use on face/ open                           Wound or open sores. Avoid contact with eyes, ears mouth and genitals (private parts).                       Wash face,  Genitals (private parts) with your normal soap.             6.  Wash thoroughly, paying special attention to the area where your surgery  will be performed.  7.  Thoroughly rinse your body with warm water from the neck down.  8.  DO NOT shower/wash with your normal soap after using and rinsing off  the CHG Soap.                9.  Pat yourself dry with a clean towel.            10.  Wear clean pajamas.            11.  Place clean sheets on your bed the night of your first shower and do not  sleep with pets. Day of Surgery : Do not apply any lotions/deodorants the morning of surgery.  Please wear clean clothes to the hospital/surgery center.  FAILURE TO FOLLOW THESE INSTRUCTIONS MAY RESULT IN THE CANCELLATION OF YOUR SURGERY PATIENT SIGNATURE_________________________________  NURSE SIGNATURE__________________________________  ________________________________________________________________________                                                        QUESTIONS Hansel Feinstein PRE OP NURSE PHONE [phone].

## 2021-05-10 NOTE — Progress Notes (Signed)
Spoke w/ via phone for pre-op interview---PT Lab needs dos---- URINE POCT             Lab results------lab appt 05-24-2021  for cbc bmp t & s, zio pztch results 12-28-2020 f/u with cardiology in 6 months per pt COVID test ---05-21-2021 overnight stay Arrive at -------530 am 05-24-2021 NPO after MN NO Solid Food.  Clear liquids from MN until---430 am Med rec completed Medications to take morning of surgery -----none Diabetic medication -----n/a Patient instructed no nail polish to be worn day of surgery Patient instructed to bring photo id and insurance card day of surgery Patient aware to have Driver (ride ) / caregiver   rhonette sumler friend  for 24 hours after surgery  Patient Special Instructions -----pt given overnight stay instructions Pre-Op special Istructions -----none Patient verbalized understanding of instructions that were given at this phone interview. Patient denies shortness of breath, chest pain, fever, cough at this phone interview.

## 2021-05-21 ENCOUNTER — Encounter (HOSPITAL_COMMUNITY)
Admission: RE | Admit: 2021-05-21 | Discharge: 2021-05-21 | Disposition: A | Discharge status: Home / Self Care | Payer: Commercial Managed Care - PPO | Source: Ambulatory Visit | Attending: Obstetrics and Gynecology | Admitting: Obstetrics and Gynecology

## 2021-05-21 ENCOUNTER — Other Ambulatory Visit: Payer: Self-pay | Admitting: Obstetrics and Gynecology

## 2021-05-21 DIAGNOSIS — Z01812 Encounter for preprocedural laboratory examination: Secondary | ICD-10-CM | POA: Diagnosis not present

## 2021-05-21 DIAGNOSIS — Z20822 Contact with and (suspected) exposure to covid-19: Secondary | ICD-10-CM | POA: Insufficient documentation

## 2021-05-21 LAB — BASIC METABOLIC PANEL
Anion gap: 8 (ref 5–15)
BUN: 11 mg/dL (ref 6–20)
CO2: 25 mmol/L (ref 22–32)
Calcium: 9.4 mg/dL (ref 8.9–10.3)
Chloride: 106 mmol/L (ref 98–111)
Creatinine, Ser: 0.78 mg/dL (ref 0.44–1.00)
GFR, Estimated: >60 mL/min (ref >60)
Glucose, Bld: 100 mg/dL — ABNORMAL HIGH (ref 70–99)
Potassium: 3.6 mmol/L (ref 3.5–5.1)
Sodium: 139 mmol/L (ref 135–145)

## 2021-05-21 LAB — CBC
HCT: 39.6 % (ref 36.0–46.0)
Hemoglobin: 12.8 g/dL (ref 12.0–15.0)
MCH: 31.2 pg (ref 26.0–34.0)
MCHC: 32.3 g/dL (ref 30.0–36.0)
MCV: 96.6 fL (ref 80.0–100.0)
Platelets: 332 10*3/uL (ref 150–400)
RBC: 4.1 MIL/uL (ref 3.87–5.11)
RDW: 13.5 % (ref 11.5–15.5)
WBC: 3.2 10*3/uL — ABNORMAL LOW (ref 4.0–10.5)
nRBC: 0 % (ref 0.0–0.2)

## 2021-05-21 LAB — SARS CORONAVIRUS 2 (TAT 6-24 HRS): SARS Coronavirus 2: NEGATIVE

## 2021-05-24 ENCOUNTER — Ambulatory Visit (HOSPITAL_BASED_OUTPATIENT_CLINIC_OR_DEPARTMENT_OTHER): Payer: Commercial Managed Care - PPO | Admitting: Anesthesiology

## 2021-05-24 ENCOUNTER — Encounter (HOSPITAL_BASED_OUTPATIENT_CLINIC_OR_DEPARTMENT_OTHER): Payer: Self-pay | Admitting: Obstetrics and Gynecology

## 2021-05-24 ENCOUNTER — Encounter (HOSPITAL_BASED_OUTPATIENT_CLINIC_OR_DEPARTMENT_OTHER)
Admission: RE | Disposition: A | Discharge status: Home / Self Care | Payer: Self-pay | Source: Ambulatory Visit | Attending: Obstetrics and Gynecology

## 2021-05-24 ENCOUNTER — Ambulatory Visit (HOSPITAL_BASED_OUTPATIENT_CLINIC_OR_DEPARTMENT_OTHER)
Admission: RE | Admit: 2021-05-24 | Discharge: 2021-05-25 | Disposition: A | Discharge status: Home / Self Care | Payer: Commercial Managed Care - PPO | Source: Ambulatory Visit | Attending: Obstetrics and Gynecology | Admitting: Obstetrics and Gynecology

## 2021-05-24 DIAGNOSIS — D259 Leiomyoma of uterus, unspecified: Secondary | ICD-10-CM | POA: Insufficient documentation

## 2021-05-24 DIAGNOSIS — N938 Other specified abnormal uterine and vaginal bleeding: Secondary | ICD-10-CM | POA: Diagnosis present

## 2021-05-24 DIAGNOSIS — R102 Pelvic and perineal pain: Secondary | ICD-10-CM | POA: Insufficient documentation

## 2021-05-24 DIAGNOSIS — N857 Hematometra: Secondary | ICD-10-CM | POA: Diagnosis not present

## 2021-05-24 DIAGNOSIS — Z90710 Acquired absence of both cervix and uterus: Secondary | ICD-10-CM | POA: Diagnosis present

## 2021-05-24 HISTORY — DX: Dyspnea, unspecified: R06.00

## 2021-05-24 HISTORY — DX: Palpitations: R00.2

## 2021-05-24 HISTORY — PX: ROBOTIC ASSISTED TOTAL HYSTERECTOMY WITH BILATERAL SALPINGO OOPHERECTOMY: SHX6086

## 2021-05-24 HISTORY — DX: Presence of spectacles and contact lenses: Z97.3

## 2021-05-24 LAB — POCT PREGNANCY, URINE: Preg Test, Ur: NEGATIVE

## 2021-05-24 LAB — ABO/RH: ABO/RH(D): AB POS

## 2021-05-24 LAB — TYPE AND SCREEN
ABO/RH(D): AB POS
Antibody Screen: NEGATIVE

## 2021-05-24 SURGERY — HYSTERECTOMY, TOTAL, ROBOT-ASSISTED, LAPAROSCOPIC, WITH BILATERAL SALPINGO-OOPHORECTOMY
Anesthesia: General | Laterality: Bilateral

## 2021-05-24 MED ORDER — ONDANSETRON HCL 4 MG/2ML IJ SOLN
INTRAMUSCULAR | Status: DC | PRN
  Administered 2021-05-24: 4 mg via INTRAVENOUS

## 2021-05-24 MED ORDER — DEXAMETHASONE SODIUM PHOSPHATE 10 MG/ML IJ SOLN
INTRAMUSCULAR | Status: AC
  Filled 2021-05-24: qty 1

## 2021-05-24 MED ORDER — MIDAZOLAM HCL 5 MG/5ML IJ SOLN
INTRAMUSCULAR | Status: DC | PRN
  Administered 2021-05-24: 2 mg via INTRAVENOUS

## 2021-05-24 MED ORDER — BUPIVACAINE HCL (PF) 0.25 % IJ SOLN
INTRAMUSCULAR | Status: DC | PRN
  Administered 2021-05-24: 20 mL

## 2021-05-24 MED ORDER — ONDANSETRON HCL 4 MG PO TABS: 4.0000 mg | ORAL_TABLET | Freq: Four times a day (QID) | ORAL | Status: DC | PRN

## 2021-05-24 MED ORDER — FENTANYL CITRATE (PF) 100 MCG/2ML IJ SOLN
INTRAMUSCULAR | Status: AC
  Filled 2021-05-24: qty 2

## 2021-05-24 MED ORDER — ONDANSETRON HCL 4 MG/2ML IJ SOLN: 4.0000 mg | Freq: Once | INTRAMUSCULAR | Status: DC | PRN

## 2021-05-24 MED ORDER — SCOPOLAMINE 1 MG/3DAYS TD PT72
MEDICATED_PATCH | TRANSDERMAL | Status: AC
  Filled 2021-05-24: qty 1

## 2021-05-24 MED ORDER — MIDAZOLAM HCL 2 MG/2ML IJ SOLN
INTRAMUSCULAR | Status: AC
  Filled 2021-05-24: qty 2

## 2021-05-24 MED ORDER — DEXTROSE IN LACTATED RINGERS 5 % IV SOLN: INTRAVENOUS | Status: DC

## 2021-05-24 MED ORDER — SUGAMMADEX SODIUM 200 MG/2ML IV SOLN
INTRAVENOUS | Status: DC | PRN
  Administered 2021-05-24: 200 mg via INTRAVENOUS

## 2021-05-24 MED ORDER — KETOROLAC TROMETHAMINE 30 MG/ML IJ SOLN
INTRAMUSCULAR | Status: AC
  Filled 2021-05-24: qty 1

## 2021-05-24 MED ORDER — ROCURONIUM BROMIDE 10 MG/ML (PF) SYRINGE
PREFILLED_SYRINGE | INTRAVENOUS | Status: AC
  Filled 2021-05-24: qty 10

## 2021-05-24 MED ORDER — DEXAMETHASONE SODIUM PHOSPHATE 4 MG/ML IJ SOLN
INTRAMUSCULAR | Status: DC | PRN
  Administered 2021-05-24: 10 mg via INTRAVENOUS

## 2021-05-24 MED ORDER — PROPOFOL 10 MG/ML IV BOLUS
INTRAVENOUS | Status: AC
  Filled 2021-05-24: qty 40

## 2021-05-24 MED ORDER — ONDANSETRON HCL 4 MG/2ML IJ SOLN
INTRAMUSCULAR | Status: AC
  Filled 2021-05-24: qty 2

## 2021-05-24 MED ORDER — LIDOCAINE HCL (PF) 2 % IJ SOLN
INTRAMUSCULAR | Status: AC
  Filled 2021-05-24: qty 5

## 2021-05-24 MED ORDER — LACTATED RINGERS IV SOLN: INTRAVENOUS | Status: DC

## 2021-05-24 MED ORDER — SODIUM CHLORIDE 0.9 % IR SOLN
Status: DC | PRN
  Administered 2021-05-24: 1000 mL

## 2021-05-24 MED ORDER — CEFAZOLIN SODIUM-DEXTROSE 2-4 GM/100ML-% IV SOLN
2.0000 g | INTRAVENOUS | Status: AC
  Administered 2021-05-24: 2 g via INTRAVENOUS

## 2021-05-24 MED ORDER — FENTANYL CITRATE (PF) 100 MCG/2ML IJ SOLN
INTRAMUSCULAR | Status: DC | PRN
  Administered 2021-05-24: 50 ug via INTRAVENOUS
  Administered 2021-05-24 (×2): 25 ug via INTRAVENOUS
  Administered 2021-05-24 (×3): 50 ug via INTRAVENOUS
  Administered 2021-05-24: 100 ug via INTRAVENOUS

## 2021-05-24 MED ORDER — OXYCODONE HCL 5 MG PO TABS
ORAL_TABLET | ORAL | Status: AC
  Filled 2021-05-24: qty 1

## 2021-05-24 MED ORDER — KETOROLAC TROMETHAMINE 30 MG/ML IJ SOLN
INTRAMUSCULAR | Status: DC | PRN
  Administered 2021-05-24: 30 mg via INTRAVENOUS

## 2021-05-24 MED ORDER — LIDOCAINE HCL (CARDIAC) PF 100 MG/5ML IV SOSY
PREFILLED_SYRINGE | INTRAVENOUS | Status: DC | PRN
  Administered 2021-05-24: 80 mg via INTRAVENOUS

## 2021-05-24 MED ORDER — KETOROLAC TROMETHAMINE 30 MG/ML IJ SOLN: 30.0000 mg | Freq: Once | INTRAMUSCULAR | Status: AC | PRN

## 2021-05-24 MED ORDER — PANTOPRAZOLE SODIUM 40 MG PO TBEC
DELAYED_RELEASE_TABLET | ORAL | Status: AC
  Filled 2021-05-24: qty 1

## 2021-05-24 MED ORDER — POVIDONE-IODINE 10 % EX SWAB: 2.0000 "application " | Freq: Once | CUTANEOUS | Status: DC

## 2021-05-24 MED ORDER — CEFAZOLIN SODIUM-DEXTROSE 2-4 GM/100ML-% IV SOLN
INTRAVENOUS | Status: AC
  Filled 2021-05-24: qty 100

## 2021-05-24 MED ORDER — SIMETHICONE 80 MG PO CHEW: 80.0000 mg | CHEWABLE_TABLET | Freq: Four times a day (QID) | ORAL | Status: DC | PRN

## 2021-05-24 MED ORDER — MENTHOL 3 MG MT LOZG: 1.0000 | LOZENGE | OROMUCOSAL | Status: DC | PRN

## 2021-05-24 MED ORDER — ONDANSETRON HCL 4 MG/2ML IJ SOLN: 4.0000 mg | Freq: Four times a day (QID) | INTRAMUSCULAR | Status: DC | PRN

## 2021-05-24 MED ORDER — PANTOPRAZOLE SODIUM 40 MG PO TBEC
40.0000 mg | DELAYED_RELEASE_TABLET | Freq: Every day | ORAL | Status: DC
  Administered 2021-05-24: 40 mg via ORAL

## 2021-05-24 MED ORDER — ROCURONIUM BROMIDE 100 MG/10ML IV SOLN
INTRAVENOUS | Status: DC | PRN
  Administered 2021-05-24 (×2): 20 mg via INTRAVENOUS
  Administered 2021-05-24: 80 mg via INTRAVENOUS

## 2021-05-24 MED ORDER — OXYCODONE HCL 5 MG PO TABS
5.0000 mg | ORAL_TABLET | ORAL | Status: DC | PRN
  Administered 2021-05-24: 5 mg via ORAL
  Administered 2021-05-24 – 2021-05-25 (×2): 10 mg via ORAL

## 2021-05-24 MED ORDER — HYDROMORPHONE HCL 1 MG/ML IJ SOLN: 0.2500 mg | INTRAMUSCULAR | Status: DC | PRN

## 2021-05-24 MED ORDER — KETOROLAC TROMETHAMINE 30 MG/ML IJ SOLN
30.0000 mg | Freq: Four times a day (QID) | INTRAMUSCULAR | Status: DC
  Administered 2021-05-24 – 2021-05-25 (×3): 30 mg via INTRAVENOUS

## 2021-05-24 MED ORDER — FENTANYL CITRATE (PF) 250 MCG/5ML IJ SOLN
INTRAMUSCULAR | Status: AC
  Filled 2021-05-24: qty 5

## 2021-05-24 MED ORDER — ROCURONIUM BROMIDE 100 MG/10ML IV SOLN
INTRAVENOUS | Status: DC | PRN
  Administered 2021-05-24: 80 mg via INTRAVENOUS

## 2021-05-24 MED ORDER — PROPOFOL 10 MG/ML IV BOLUS
INTRAVENOUS | Status: DC | PRN
  Administered 2021-05-24: 200 mg via INTRAVENOUS
  Administered 2021-05-24: 100 mg via INTRAVENOUS

## 2021-05-24 MED ORDER — OXYCODONE HCL 5 MG PO TABS
ORAL_TABLET | ORAL | Status: AC
  Filled 2021-05-24: qty 2

## 2021-05-24 MED ORDER — IBUPROFEN 200 MG PO TABS: 600.0000 mg | ORAL_TABLET | Freq: Four times a day (QID) | ORAL | Status: DC

## 2021-05-24 SURGICAL SUPPLY — 62 items
ADH SKN CLS APL DERMABOND .7 (GAUZE/BANDAGES/DRESSINGS) ×2
APL SRG 38 LTWT LNG FL B (MISCELLANEOUS) ×1
APPLICATOR ARISTA FLEXITIP XL (MISCELLANEOUS) ×1 IMPLANT
BARRIER ADHS 3X4 INTERCEED (GAUZE/BANDAGES/DRESSINGS) IMPLANT
BRR ADH 4X3 ABS CNTRL BYND (GAUZE/BANDAGES/DRESSINGS)
CATH FOLEY 3WAY  5CC 16FR (CATHETERS) ×2
CATH FOLEY 3WAY 5CC 16FR (CATHETERS) ×1 IMPLANT
COVER BACK TABLE 60X90IN (DRAPES) ×2 IMPLANT
COVER TIP SHEARS 8 DVNC (MISCELLANEOUS) ×1 IMPLANT
COVER TIP SHEARS 8MM DA VINCI (MISCELLANEOUS) ×2
DECANTER SPIKE VIAL GLASS SM (MISCELLANEOUS) ×2 IMPLANT
DEFOGGER SCOPE WARMER CLEARIFY (MISCELLANEOUS) ×2 IMPLANT
DERMABOND ADVANCED (GAUZE/BANDAGES/DRESSINGS) ×2
DERMABOND ADVANCED .7 DNX12 (GAUZE/BANDAGES/DRESSINGS) ×1 IMPLANT
DRAPE ARM DVNC X/XI (DISPOSABLE) ×4 IMPLANT
DRAPE COLUMN DVNC XI (DISPOSABLE) ×1 IMPLANT
DRAPE DA VINCI XI ARM (DISPOSABLE) ×8
DRAPE DA VINCI XI COLUMN (DISPOSABLE) ×2
DRAPE UTILITY XL STRL (DRAPES) ×2 IMPLANT
DURAPREP 26ML APPLICATOR (WOUND CARE) ×2 IMPLANT
ELECT REM PT RETURN 9FT ADLT (ELECTROSURGICAL) ×2
ELECTRODE REM PT RTRN 9FT ADLT (ELECTROSURGICAL) ×1 IMPLANT
GAUZE 4X4 16PLY ~~LOC~~+RFID DBL (SPONGE) ×4 IMPLANT
GLOVE SURG LTX SZ6.5 (GLOVE) ×6 IMPLANT
GLOVE SURG UNDER POLY LF SZ7 (GLOVE) ×10 IMPLANT
HEMOSTAT ARISTA ABSORB 3G PWDR (HEMOSTASIS) ×1 IMPLANT
HOLDER FOLEY CATH W/STRAP (MISCELLANEOUS) IMPLANT
IRRIG SUCT STRYKERFLOW 2 WTIP (MISCELLANEOUS) ×2
IRRIGATION SUCT STRKRFLW 2 WTP (MISCELLANEOUS) ×1 IMPLANT
KIT TURNOVER CYSTO (KITS) ×2 IMPLANT
LEGGING LITHOTOMY PAIR STRL (DRAPES) ×2 IMPLANT
NEEDLE INSUFFLATION 120MM (ENDOMECHANICALS) ×2 IMPLANT
OBTURATOR OPTICAL STANDARD 8MM (TROCAR) ×2
OBTURATOR OPTICAL STND 8 DVNC (TROCAR) ×1
OBTURATOR OPTICALSTD 8 DVNC (TROCAR) ×1 IMPLANT
OCCLUDER COLPOPNEUMO (BALLOONS) ×2 IMPLANT
PACK ROBOT WH (CUSTOM PROCEDURE TRAY) ×2 IMPLANT
PACK ROBOTIC GOWN (GOWN DISPOSABLE) ×2 IMPLANT
PACK TRENDGUARD 450 HYBRID PRO (MISCELLANEOUS) ×1 IMPLANT
PAD OB MATERNITY 4.3X12.25 (PERSONAL CARE ITEMS) ×2 IMPLANT
PAD PREP 24X48 CUFFED NSTRL (MISCELLANEOUS) ×2 IMPLANT
PROTECTOR NERVE ULNAR (MISCELLANEOUS) ×4 IMPLANT
SEAL CANN UNIV 5-8 DVNC XI (MISCELLANEOUS) ×4 IMPLANT
SEAL XI 5MM-8MM UNIVERSAL (MISCELLANEOUS) ×8
SEALER VESSEL DA VINCI XI (MISCELLANEOUS) ×2
SEALER VESSEL EXT DVNC XI (MISCELLANEOUS) ×1 IMPLANT
SET IRRIG Y TYPE TUR BLADDER L (SET/KITS/TRAYS/PACK) IMPLANT
SET TRI-LUMEN FLTR TB AIRSEAL (TUBING) ×2 IMPLANT
SPONGE T-LAP 4X18 ~~LOC~~+RFID (SPONGE) ×2 IMPLANT
SUT VIC AB 0 CT1 36 (SUTURE) ×4 IMPLANT
SUT VIC AB 4-0 PS2 18 (SUTURE) ×4 IMPLANT
SUT VLOC 180 0 9IN  GS21 (SUTURE) ×2
SUT VLOC 180 0 9IN GS21 (SUTURE) ×1 IMPLANT
TIP RUMI ORANGE 6.7MMX12CM (TIP) IMPLANT
TIP UTERINE 5.1X6CM LAV DISP (MISCELLANEOUS) IMPLANT
TIP UTERINE 6.7X10CM GRN DISP (MISCELLANEOUS) IMPLANT
TIP UTERINE 6.7X6CM WHT DISP (MISCELLANEOUS) IMPLANT
TIP UTERINE 6.7X8CM BLUE DISP (MISCELLANEOUS) IMPLANT
TOWEL OR 17X26 10 PK STRL BLUE (TOWEL DISPOSABLE) ×4 IMPLANT
TRENDGUARD 450 HYBRID PRO PACK (MISCELLANEOUS) ×2
TROCAR PORT AIRSEAL 8X120 (TROCAR) ×2 IMPLANT
WATER STERILE IRR 1000ML POUR (IV SOLUTION) ×2 IMPLANT

## 2021-05-24 NOTE — Progress Notes (Signed)
Ambulated from stretcher in hallway to the bed.

## 2021-05-24 NOTE — Anesthesia Postprocedure Evaluation (Signed)
Anesthesia Post Note  Patient: SAKIYAH SHUR  Procedure(s) Performed: XI ROBOTIC ASSISTED TOTAL HYSTERECTOMY WITH BILATERAL SALPINGECTOMY  (Bilateral)     Patient location during evaluation: PACU Anesthesia Type: General Level of consciousness: awake and alert Pain management: pain level controlled Vital Signs Assessment: post-procedure vital signs reviewed and stable Respiratory status: spontaneous breathing, nonlabored ventilation, respiratory function stable and patient connected to nasal cannula oxygen Cardiovascular status: blood pressure returned to baseline and stable Postop Assessment: no apparent nausea or vomiting Anesthetic complications: no   No notable events documented.  Last Vitals:  Vitals:   05/24/21 1100 05/24/21 1122  BP: (!) 150/82 (!) 149/84  Pulse: 68 80  Resp: 13 14  Temp:  36.5 C  SpO2: 99% 97%    Last Pain:  Vitals:   05/24/21 1045  TempSrc:   PainSc: 0-No pain                 Chyann Ambrocio S

## 2021-05-24 NOTE — Anesthesia Preprocedure Evaluation (Signed)
Anesthesia Evaluation  Patient identified by MRN, date of birth, ID band Patient awake    Reviewed: Allergy & Precautions, NPO status , Patient's Chart, lab work & pertinent test results  Airway Mallampati: II  TM Distance: >3 FB Neck ROM: Full    Dental no notable dental hx.    Pulmonary neg pulmonary ROS,    Pulmonary exam normal breath sounds clear to auscultation       Cardiovascular negative cardio ROS Normal cardiovascular exam Rhythm:Regular Rate:Normal     Neuro/Psych  Headaches, negative psych ROS   GI/Hepatic negative GI ROS, Neg liver ROS,   Endo/Other  negative endocrine ROS  Renal/GU negative Renal ROS  negative genitourinary   Musculoskeletal negative musculoskeletal ROS (+)   Abdominal   Peds negative pediatric ROS (+)  Hematology negative hematology ROS (+)   Anesthesia Other Findings   Reproductive/Obstetrics negative OB ROS                             Anesthesia Physical Anesthesia Plan  ASA: 1  Anesthesia Plan: General   Post-op Pain Management:    Induction: Intravenous  PONV Risk Score and Plan: 3 and Ondansetron, Dexamethasone and Treatment may vary due to age or medical condition  Airway Management Planned: Oral ETT  Additional Equipment:   Intra-op Plan:   Post-operative Plan: Extubation in OR  Informed Consent: I have reviewed the patients History and Physical, chart, labs and discussed the procedure including the risks, benefits and alternatives for the proposed anesthesia with the patient or authorized representative who has indicated his/her understanding and acceptance.     Dental advisory given  Plan Discussed with: CRNA and Surgeon  Anesthesia Plan Comments:         Anesthesia Quick Evaluation

## 2021-05-24 NOTE — Brief Op Note (Signed)
05/24/2021  10:04 AM  PATIENT:  Teresa Lynch  50 y.o. female  PRE-OPERATIVE DIAGNOSIS:  Dysfunctional uterine bleeding, pelvic pain and hx of endometrial ablation  POST-OPERATIVE DIAGNOSIS:  Dysfunctional uterine bleeding, pelvic pain and hx of endometrial ablation  PROCEDURE:  Procedure(s): XI ROBOTIC ASSISTED TOTAL HYSTERECTOMY WITH BILATERAL SALPINGECTOMY  (Bilateral)  SURGEON:  Surgeon(s) and Role:    * Servando Salina, MD - Primary  PHYSICIAN ASSISTANT:   ASSISTANTS: Gaylord Shih RNFA   ANESTHESIA:   general FINDINGS: HEMATOMETRIA, SURGICAL SEPARATED TUBES, IRREG UTERUS, NL APPENDIX EBL:  25 mL   BLOOD ADMINISTERED:none  DRAINS: none   LOCAL MEDICATIONS USED:  MARCAINE     SPECIMEN:  Source of Specimen:  uterus with cervix, bilateral tubes  DISPOSITION OF SPECIMEN:  PATHOLOGY  COUNTS:  YES  TOURNIQUET:  * No tourniquets in log *  DICTATION: other dictation: [id number]  PLAN OF CARE: Admit for overnight observation  PATIENT DISPOSITION:  PACU - hemodynamically stable.   Delay start of Pharmacological VTE agent (>24hrs) due to surgical blood loss or risk of bleeding: no

## 2021-05-24 NOTE — H&P (Signed)
Teresa Lynch is an 50 y.o. female. G5P5 BF hx TL, endometrial ablation for davinci robotic total hysterectomy, bilateral salpingectomy due to severe pelvic pain and prolonged bleeding. Bleeding resumed after several years of no cycle post endometrial ablationpelvic   Pertinent Gynecological History: Menses: with severe dysmenorrhea Bleeding: dysfunctional uterine bleeding Contraception: tubal ligation DES exposure: denies Blood transfusions: none Sexually transmitted diseases: no past history Previous GYN Procedures:  endometrial ablation, dx hysteroscopy   Last mammogram: normal Date: 2022 Last pap: normal Date: 2022 OB History: G5, P5   Menstrual History: Menarche age: n/a Patient's last menstrual period was 01/03/2021 (approximate).    Past Medical History:  Diagnosis Date   Anemia    Dyspnea    WITH HEAVY EXERTION   Headache    Migraines   Heart murmur    only when pregnant in 2001   Palpitations    WORE ZIO MONITOR REPORT IN [facility] 12-28-2020   Vaginal delivery 1991, 1993, 1994, 1998, 2001   Wears contact lenses     Past Surgical History:  Procedure Laterality Date   DILITATION & CURRETTAGE/HYSTROSCOPY WITH NOVASURE ABLATION N/A 10/18/2015   Procedure: DILATATION & CURETTAGE/HYSTEROSCOPY WITH NOVASURE ABLATION;  Surgeon: Princess Bruins, MD;  Location: [facility];  Service: Gynecology;  Laterality: N/A;   TUBAL LIGATION  2001   WISDOM TOOTH EXTRACTION     YRS AGO    Family History  Problem Relation Age of Onset   Breast cancer Mother        d. 65   Sarcoidosis Maternal Aunt        d. 33s   Breast cancer Maternal Aunt        dx 61s or younger   Cirrhosis Maternal Uncle        +EtOH abuse; d. 82   Breast cancer Paternal Aunt        dx under 72y; d. 46y   Dementia Maternal Grandmother        d. 31   Breast cancer Maternal Grandmother        dx under age of 71   Heart attack Maternal Grandfather        d. 36s   Down syndrome Sister        d. 32   Cancer  Maternal Uncle        d. 41s; unspecified type; smoker   Breast cancer Cousin 25       maternal 1st cousin; d. 4   Down syndrome Maternal Aunt        d. late 53s-30s    Social History:  reports that she has never smoked. She has never used smokeless tobacco. She reports that she does not currently use alcohol. She reports that she does not use drugs.  Allergies: No Known Allergies  No medications prior to admission.    Review of Systems  All other systems reviewed and are negative.  Height 5\' 6"  (1.676 m), weight 80.7 kg, last menstrual period 01/03/2021. Physical Exam Constitutional:      Appearance: Normal appearance.  HENT:     Head: Atraumatic.  Eyes:     Extraocular Movements: Extraocular movements intact.  Cardiovascular:     Rate and Rhythm: Regular rhythm.     Heart sounds: Normal heart sounds.  Abdominal:     General: Abdomen is flat.     Palpations: Abdomen is soft.  Genitourinary:    General: Normal vulva.     Comments: Vagina nl  Cervix  bulbous, parous Uterus  RV bulky 10 wk Adnexa no palp mass Musculoskeletal:        General: Normal range of motion.     Cervical back: Neck supple.  Skin:    General: Skin is warm and dry.  Neurological:     General: No focal deficit present.     Mental Status: She is alert and oriented to person, place, and time.  Psychiatric:        Mood and Affect: Mood normal.        Behavior: Behavior normal.    No results found for this or any previous visit (from the past 24 hour(s)).  No results found.  Assessment/Plan: Dub Pelvic pain  Hx endometrial ablation ? Hematometra P)daVinci robotic total hysterectomy, bilateral salpingectomy. Procedure reviewed. Risk of surgery discussed including but not limited to  infection, bleeding, injury to bladder, bowel, ureter, underlying organ structures, poss need for blood transfusion and its risk( HIV, acute rxn, hepatitis). Poss need for surgery on ovaries in the future due to  disease. All ? answered  Adaeze Better A Larue Lightner 05/24/2021, 5:19 AM

## 2021-05-24 NOTE — Interval H&P Note (Signed)
History and Physical Interval Note:  05/24/2021 7:33 AM  [place]  has presented today for surgery, with the diagnosis of Dysfunctional uterine bleeding, pelvic pain and hx of endometrial ablation.  The various methods of treatment have been discussed with the patient and family. After consideration of risks, benefits and other options for treatment, the patient has consented to  PROCEDURES: [place], BILATERAL SALPINGECTOMY as a surgical intervention.  The patient's history has been reviewed, patient examined, no change in status, stable for surgery.  I have reviewed the patient's chart and labs.  Questions were answered to the patient's satisfaction.     Januel Doolan A Lucien Budney

## 2021-05-24 NOTE — Anesthesia Procedure Notes (Signed)
Procedure Name: Intubation Date/Time: 05/24/2021 7:49 AM Performed by: Justice Rocher, CRNA Pre-anesthesia Checklist: Patient identified, Emergency Drugs available, Suction available, Patient being monitored and Timeout performed Patient Re-evaluated:Patient Re-evaluated prior to induction Oxygen Delivery Method: Circle system utilized Preoxygenation: Pre-oxygenation with 100% oxygen Induction Type: IV induction Ventilation: Mask ventilation without difficulty Grade View: Grade II Tube type: Oral Tube size: 7.0 mm Number of attempts: 1 Airway Equipment and Method: Stylet and Oral airway Placement Confirmation: ETT inserted through vocal cords under direct vision, positive ETCO2, breath sounds checked- equal and bilateral and CO2 detector Secured at: 23 cm Tube secured with: Tape Dental Injury: Teeth and Oropharynx as per pre-operative assessment

## 2021-05-24 NOTE — Transfer of Care (Signed)
Immediate Anesthesia Transfer of Care Note  Patient: Teresa Lynch  Procedure(s) Performed: Procedure(s) (LRB): XI ROBOTIC ASSISTED TOTAL HYSTERECTOMY WITH BILATERAL SALPINGECTOMY  (Bilateral)  Patient Location: PACU  Anesthesia Type: General  Level of Consciousness: awake, sedated, patient cooperative and responds to stimulation  Airway & Oxygen Therapy: Patient Spontanous Breathing and Patient connected to [place] 02 and soft FM   Post-op Assessment: Report given to PACU RN, Post -op Vital signs reviewed and stable and Patient moving all extremities  Post vital signs: Reviewed and stable  Complications: No apparent anesthesia complications

## 2021-05-25 DIAGNOSIS — D259 Leiomyoma of uterus, unspecified: Secondary | ICD-10-CM | POA: Diagnosis not present

## 2021-05-25 LAB — CBC
HCT: 33.4 % — ABNORMAL LOW (ref 36.0–46.0)
Hemoglobin: 11 g/dL — ABNORMAL LOW (ref 12.0–15.0)
MCH: 30.8 pg (ref 26.0–34.0)
MCHC: 32.9 g/dL (ref 30.0–36.0)
MCV: 93.6 fL (ref 80.0–100.0)
Platelets: 255 10*3/uL (ref 150–400)
RBC: 3.57 MIL/uL — ABNORMAL LOW (ref 3.87–5.11)
RDW: 13.2 % (ref 11.5–15.5)
WBC: 5.8 10*3/uL (ref 4.0–10.5)
nRBC: 0 % (ref 0.0–0.2)

## 2021-05-25 LAB — BASIC METABOLIC PANEL
Anion gap: 6 (ref 5–15)
BUN: 6 mg/dL (ref 6–20)
CO2: 26 mmol/L (ref 22–32)
Calcium: 9 mg/dL (ref 8.9–10.3)
Chloride: 106 mmol/L (ref 98–111)
Creatinine, Ser: 0.79 mg/dL (ref 0.44–1.00)
GFR, Estimated: >60 mL/min (ref >60)
Glucose, Bld: 178 mg/dL — ABNORMAL HIGH (ref 70–99)
Potassium: 3.6 mmol/L (ref 3.5–5.1)
Sodium: 138 mmol/L (ref 135–145)

## 2021-05-25 MED ORDER — OXYCODONE HCL 5 MG PO TABS: 5.0000 mg | ORAL_TABLET | ORAL | 0 refills | Status: AC | PRN

## 2021-05-25 MED ORDER — KETOROLAC TROMETHAMINE 30 MG/ML IJ SOLN
INTRAMUSCULAR | Status: AC
  Filled 2021-05-25: qty 1

## 2021-05-25 MED ORDER — OXYCODONE HCL 5 MG PO TABS
ORAL_TABLET | ORAL | Status: AC
  Filled 2021-05-25: qty 2

## 2021-05-25 MED ORDER — IBUPROFEN 600 MG PO TABS: 600.0000 mg | ORAL_TABLET | Freq: Four times a day (QID) | ORAL | 11 refills | Status: AC | PRN

## 2021-05-25 NOTE — Discharge Summary (Signed)
Physician Discharge Summary  Patient ID: Teresa Lynch MRN: [id number] DOB/AGE: Apr 02, 1971 50 y.o.  Admit date: 05/24/2021 Discharge date: 05/25/2021  Admission Diagnoses: DUB, pelvic pain, hx endometrial ablation  Discharge Diagnoses: DUB, pelvic pain, hx endometrial ablation, hematometra Active Problems:   Dysfunctional uterine bleeding   S/P hysterectomy   Discharged Condition: stable  Hospital Course: pt underwent Davinci robotic total hysterectomy, bilateral salpingectomy. Uncomplicated postop course  Consults: None  Significant Diagnostic Studies: labs:  CBC Latest Ref Rng & Units 05/25/2021 05/21/2021 07/20/2017  WBC 4.0 - 10.5 K/uL 5.8 3.2(L) 5.2  Hemoglobin 12.0 - 15.0 g/dL 11.0(L) 12.8 11.9(L)  Hematocrit 36.0 - 46.0 % 33.4(L) 39.6 35.5(L)  Platelets 150 - 400 K/uL 255 332 263    BMP Latest Ref Rng & Units 05/25/2021 05/21/2021 07/20/2017  Glucose 70 - 99 mg/dL 178(H) 100(H) 101(H)  BUN 6 - 20 mg/dL 6 11 10   Creatinine 0.44 - 1.00 mg/dL 0.79 0.78 0.64  Sodium 135 - 145 mmol/L 138 139 138  Potassium 3.5 - 5.1 mmol/L 3.6 3.6 3.6  Chloride 98 - 111 mmol/L 106 106 106  CO2 22 - 32 mmol/L 26 25 25   Calcium 8.9 - 10.3 mg/dL 9.0 9.4 8.9     Treatments: surgery: davinci robotic total hysterectomy, bilateral salpingectomy  Discharge Exam: Blood pressure 117/80, pulse 74, temperature 98.1 F (36.7 C), resp. rate 16, height 5\' 6"  (1.676 m), weight 82.2 kg, last menstrual period 05/13/2021, SpO2 100 %. General appearance: alert, cooperative, and no distress Resp: clear to auscultation bilaterally Breasts: normal appearance, no masses or tenderness Cardio: regular rate and rhythm, S1, S2 normal, no murmur, click, rub or gallop GI: soft, non-tender; bowel sounds normal; no masses,  no organomegaly Pelvic: deferred Extremities: no edema, redness or tenderness in the calves or thighs Incision/Wound: intact/no erythema/induration  Disposition: Discharge disposition: 01-Home  or Self Care       Discharge Instructions     Call MD for:  persistant nausea and vomiting   Complete by: As directed    Call MD for:  redness, tenderness, or signs of infection (pain, swelling, redness, odor or green/yellow discharge around incision site)   Complete by: As directed    Call MD for:  severe uncontrolled pain   Complete by: As directed    Call MD for:  temperature >100.4   Complete by: As directed    Diet - low sodium heart healthy   Complete by: As directed    Diet general   Complete by: As directed    Discharge instructions   Complete by: As directed    CALL  IF TEMP>100.4, NOTHING PER VAGINA X 2 WK, CALL IF SOAKING A MAXI  PAD EVERY HOUR OR MORE FREQUENTLY   Increase activity slowly   Complete by: As directed    No wound care   Complete by: As directed       Allergies as of 05/25/2021   Not on File      Medication List     TAKE these medications    Emgality 120 MG/ML Soaj Generic drug: Galcanezumab-gnlm Inject into the skin as needed.   ibuprofen 600 MG tablet Commonly known as: ADVIL Take 1 tablet (600 mg total) by mouth every 6 (six) hours as needed.   oxyCODONE 5 MG immediate release tablet Commonly known as: Oxy IR/ROXICODONE Take 1-2 tablets (5-10 mg total) by mouth every 4 (four) hours as needed for up to 7 days for moderate pain.  Follow-up Information     Servando Salina, MD Follow up in 2 week(s).   Specialty: Obstetrics and Gynecology Contact information: [place] [address] [phone]                 Signed: Marvene Staff 05/25/2021, 9:07 AM

## 2021-05-25 NOTE — Op Note (Signed)
NAME: Teresa Lynch, Teresa Lynch MEDICAL RECORD NO: [id number] ACCOUNT NO: [id number] DATE OF BIRTH: 1970-11-18 FACILITY: [facility] LOCATION: [facility] PHYSICIAN: Blong Busk A. Garwin Brothers, MD  Operative Report   DATE OF PROCEDURE: 05/24/2021  PREOPERATIVE DIAGNOSES:  Dysfunctional uterine bleeding, pelvic pain, history of endometrial ablation.  POSTOPERATIVE DIAGNOSIS:  Dysfunctional uterine bleeding, pelvic pain, history of endometrial ablation, hematometra.   PROCEDURE: Da Vinci robotic total hysterectomy, bilateral salpingectomy.  ANESTHESIA:  General.  SURGEON:  Jef Futch A. Garwin Brothers, MD.  ASSISTANT:  Gaylord Shih, RNFA.  DESCRIPTION OF PROCEDURE:  Under adequate general anesthesia, the patient was positioned for robotic surgery.  She was placed in the dorsal lithotomy position.  The patient was sterilely prepped and draped in the usual fashion.  A 3-way Foley catheter  was placed, draining Pyridium-colored urine.  Examination under anesthesia had revealed a retroverted uterus.  No adnexal masses could be appreciated.  A weighted speculum was placed in the vagina.  A Sims retractor was placed anteriorly.  A large  bulbous cervix was encountered.  0 Vicryl figure-of-eight suture was placed on the anterior and posterior lip of the cervix.  The uterus sounded to 9 cm.  A #8 mm curved uterine manipulator with a large KOH Rumi cup was placed and the retractor was removed.   Attention was then turned to the abdomen.  0.25% Marcaine was injected.  A curved supraumbilical incision was made.  Veress needle was introduced.  Opening pressure of 4 was noted.  3.6 L of CO2 was insufflated after the Veress needle was tested.  The  Veress needle was then removed.  An 8 mm robotic port was introduced into the abdomen without incident.  The robotic camera was inserted, showing entry into the abdomen without incident.  Panoramic inspection noted normal liver edge.  The patient was  placed in the deep Trendelenburg  position.  The pelvis was inspected.  Uterus was noted with both ovaries to be normal, both tubes being surgically separated.  Additional port sites were then placed under direct visualization 9 cm apart bilaterally with  an AirSeal 8 mm port site being placed in the left mid position.  Two robotic ports placed on the right, one was on the far left and in between the AirSeal.  Once these port placements were done under direct visualization, the robot was then docked.  In  arm #1, the vessel sealer was placed, arm #3 was the long bipolar grasper and in arm #4 was the monopolar scissors.  I then went to the surgical console.  The pelvis was inspected.  Starting with identifying the ureters being deep in the pelvis, both  peristalsing.  The procedure was started on the left side where the separated fallopian tubes remnants were grasped, the underlying mesosalpinx were serially clamped, cauterized and cut using the vessel sealer and the bipolar graspers and scissors.  Once  those were removed, the retroperitoneal space was opened and the medial leaf of the broad ligament was opened.  The left uteroovarian ligament was serially clamped, cauterized and then cut.  The posterior leaf of the broad ligament then continued to be  displaced sharply. Inferiorly, the round ligament was then clamped, cauterized and cut.  The anterior vesicouterine peritoneum was opened transversely and dissection was then made displacing the bladder further inferiorly.  The uterine vessels were then  skeletonized.  They were then serially clamped, cauterized and cut on the left.  The same procedure was performed on the contralateral side.  Once that, both uterine vessels  were then serially clamped, cauterized and cut.  A vaginal insufflator was then  performed.  The area over the cuff anteriorly was further displaced inferiorly and the cervicovaginal junction was opened transversely and carried around circumferentially with subsequent  detachment of the uterus from the vagina.  The specimen was  removed.  The vaginal cuff had bleeding, which was cauterized.  The insufflator was reinserted.  The following instruments were exchanged.  The vessel sealer was exchanged for a long tipped forceps and the scissors was replaced by the large mega suture  needle driver. 0 V-Loc suture x2 was placed with closure of the vaginal cuff in a 2-layer fashion.  Once this was done, the vaginal cuff was inspected with digital palpation.  The abdomen was irrigated and suctioned of debris.  The appendix was noted to  be elongated, but normal. The abdominal pressure was decreased to 8, checked for any bleeders.  Good hemostasis was noted, at which time the Arista potato starch was then dispersed over the vaginal cuff.  The instruments were then removed.  The robot was  undocked.  I went back to the patient's bedside sterilely.  The abdomen was deflated and the ports were removed.  The AirSeal was then removed last.  The incisions were then closed with 4-0 Vicryl subcuticular closures.  I went back to the vagina.  A  speculum was used to inspect as well as digital palpation with good hemostasis noted, at which point the procedure was felt to be complete.  Specimen labeled uterus with cervix and fallopian tubes were sent to Pathology.  ESTIMATED BLOOD LOSS:  25 mL   INTRAOPERATIVE FLUID:  1 liter.  URINE OUTPUT:  Approximately 300 mL.  Sponge and instrument counts x2 was correct.  Complication was none.  The patient tolerated the procedure well and was transferred to recovery room in stable condition.      PAA D: 05/24/2021 11:21:16 pm T: 05/25/2021 5:03:00 am  JOB: [id number]/ [id number]

## 2021-05-27 ENCOUNTER — Encounter (HOSPITAL_BASED_OUTPATIENT_CLINIC_OR_DEPARTMENT_OTHER): Payer: Self-pay | Admitting: Obstetrics and Gynecology

## 2021-05-27 LAB — SURGICAL PATHOLOGY

## 2021-09-13 ENCOUNTER — Ambulatory Visit (HOSPITAL_COMMUNITY)
Admission: EM | Admit: 2021-09-13 | Discharge: 2021-09-13 | Disposition: A | Discharge status: Home / Self Care | Payer: Commercial Managed Care - PPO | Attending: Student | Admitting: Student

## 2021-09-13 ENCOUNTER — Other Ambulatory Visit: Payer: Self-pay

## 2021-09-13 ENCOUNTER — Encounter (HOSPITAL_COMMUNITY): Payer: Self-pay

## 2021-09-13 DIAGNOSIS — H66001 Acute suppurative otitis media without spontaneous rupture of ear drum, right ear: Secondary | ICD-10-CM

## 2021-09-13 MED ORDER — AMOXICILLIN 250 MG/5ML PO SUSR: 875.0000 mg | Freq: Two times a day (BID) | ORAL | 0 refills | Status: AC

## 2021-09-13 NOTE — Discharge Instructions (Addendum)
-  Amoxicillin twice daily x7 days -You can take Tylenol up to 1000 mg 3 times daily, and ibuprofen up to 600 mg 3 times daily with food.  You can take these together, or alternate every 3-4 hours. -Follow-up if symptoms worsen/persist

## 2021-09-13 NOTE — ED Provider Notes (Signed)
[facility]    CSN: [id number] Arrival date & time: 09/13/21  0944      History   Chief Complaint No chief complaint on file.   HPI Teresa Lynch is a 51 y.o. female presenting with viral syndrome x5 days.  Medical history noncontributory.  Describes 5 days of cough, congestion, sore throat, sinus pressure, right ear pressure.  Denies dizziness, tinnitus.  Fever as high as 100.4 four days ago.  Exposure to friend who had similar symptoms.  Has had a negative home COVID test, no other testing. Ablation for contraception.  HPI  Past Medical History:  Diagnosis Date   Anemia    Dyspnea    WITH HEAVY EXERTION   Headache    Migraines   Heart murmur    only when pregnant in 2001   Palpitations    WORE ZIO MONITOR REPORT IN [facility] 12-28-2020   Vaginal delivery 1991, 1993, 1994, 1998, 2001   Wears contact lenses     Patient Active Problem List   Diagnosis Date Noted   Dysfunctional uterine bleeding 05/24/2021   S/P hysterectomy 05/24/2021   Family history of breast cancer in female 05/30/2016   Iron deficiency anemia 05/23/2016   Paresthesia 04/16/2016    Past Surgical History:  Procedure Laterality Date   DILITATION & CURRETTAGE/HYSTROSCOPY WITH NOVASURE ABLATION N/A 10/18/2015   Procedure: DILATATION & CURETTAGE/HYSTEROSCOPY WITH NOVASURE ABLATION;  Surgeon: Princess Bruins, MD;  Location: [facility];  Service: Gynecology;  Laterality: N/A;   ROBOTIC ASSISTED TOTAL HYSTERECTOMY WITH BILATERAL SALPINGO OOPHERECTOMY Bilateral 05/24/2021   Procedure: XI ROBOTIC ASSISTED TOTAL HYSTERECTOMY WITH BILATERAL SALPINGECTOMY ;  Surgeon: Servando Salina, MD;  Location: [facility];  Service: Gynecology;  Laterality: Bilateral;   TUBAL LIGATION  2001   WISDOM TOOTH EXTRACTION     YRS AGO    OB History     Gravida  5   Para  5   Term      Preterm      AB      Living  5      SAB      IAB      Ectopic      Multiple      Live Births                Home Medications    Prior to Admission medications   Medication Sig Start Date End Date Taking? Authorizing Provider  amoxicillin (AMOXIL) 250 MG/5ML suspension Take 17.5 mLs (875 mg total) by mouth 2 (two) times daily for 7 days. 09/13/21 09/20/21 Yes Hazel Sams, PA-C  Galcanezumab-gnlm [facility]) 120 MG/ML SOAJ Inject into the skin as needed.    [provider]  ibuprofen (ADVIL) 600 MG tablet Take 1 tablet (600 mg total) by mouth every 6 (six) hours as needed. 05/25/21   Servando Salina, MD    Family History Family History  Problem Relation Age of Onset   Breast cancer Mother        d. 41   Sarcoidosis Maternal Aunt        d. 74s   Breast cancer Maternal Aunt        dx 43s or younger   Cirrhosis Maternal Uncle        +EtOH abuse; d. 84   Breast cancer Paternal Aunt        dx under 67y; d. 29y   Dementia Maternal Grandmother        d. 69  Breast cancer Maternal Grandmother        dx under age of 2   Heart attack Maternal Grandfather        d. 46s   Down syndrome Sister        d. 81   Cancer Maternal Uncle        d. 14s; unspecified type; smoker   Breast cancer Cousin 101       maternal 1st cousin; d. 46   Down syndrome Maternal Aunt        d. late 61s-30s    Social History Social History   Tobacco Use   Smoking status: Never   Smokeless tobacco: Never  Vaping Use   Vaping Use: Never used  Substance Use Topics   Alcohol use: Not Currently   Drug use: No     Allergies   Patient has no allergy information on record.   Review of Systems Review of Systems  Constitutional:  Negative for appetite change, chills and fever.  HENT:  Positive for sore throat. Negative for congestion, ear pain, rhinorrhea, sinus pressure and sinus pain.   Eyes:  Negative for redness and visual disturbance.  Respiratory:  Negative for cough, chest tightness, shortness of breath and wheezing.   Cardiovascular:  Negative for chest pain and  palpitations.  Gastrointestinal:  Negative for abdominal pain, constipation, diarrhea, nausea and vomiting.  Genitourinary:  Negative for dysuria, frequency and urgency.  Musculoskeletal:  Negative for myalgias.  Neurological:  Negative for dizziness, weakness and headaches.  Psychiatric/Behavioral:  Negative for confusion.   All other systems reviewed and are negative.   Physical Exam Triage Vital Signs ED Triage Vitals [09/13/21 0954]  Enc Vitals Group     BP (!) 156/98     Pulse Rate 96     Resp 16     Temp 98.1 F (36.7 C)     Temp src      SpO2 100 %     Weight      Height      Head Circumference      Peak Flow      Pain Score      Pain Loc      Pain Edu?      Excl. in [place]?    No data found.  Updated Vital Signs BP (!) 156/98 (BP Location: Left Arm)    Pulse 96    Temp 98.1 F (36.7 C)    Resp 16    SpO2 100%   Visual Acuity Right Eye Distance:   Left Eye Distance:   Bilateral Distance:    Right Eye Near:   Left Eye Near:    Bilateral Near:     Physical Exam Vitals reviewed.  Constitutional:      General: She is not in acute distress.    Appearance: Normal appearance. She is not ill-appearing.  HENT:     Head: Normocephalic and atraumatic.     Right Ear: Ear canal and external ear normal. No tenderness. A middle ear effusion is present. There is no impacted cerumen. Tympanic membrane is erythematous. Tympanic membrane is not perforated, retracted or bulging.     Left Ear: Tympanic membrane, ear canal and external ear normal. No tenderness.  No middle ear effusion. There is no impacted cerumen. Tympanic membrane is not perforated, erythematous, retracted or bulging.     Nose: Congestion present.     Right Sinus: Maxillary sinus tenderness present. No frontal sinus tenderness.     Left  Sinus: Maxillary sinus tenderness present. No frontal sinus tenderness.     Mouth/Throat:     Mouth: Mucous membranes are moist.     Pharynx: Uvula midline. Posterior  oropharyngeal erythema present. No oropharyngeal exudate.     Tonsils: Tonsillar exudate present. 1+ on the right. 1+ on the left.     Comments: Erythema posterior pharynx. Tonsils 1+ bilaterally with exudate. On exam, uvula is midline, she is tolerating her secretions without difficulty, there is no trismus, no drooling, she has normal phonation  Eyes:     Extraocular Movements: Extraocular movements intact.     Pupils: Pupils are equal, round, and reactive to light.  Cardiovascular:     Rate and Rhythm: Normal rate and regular rhythm.     Heart sounds: Normal heart sounds.  Pulmonary:     Effort: Pulmonary effort is normal.     Breath sounds: Normal breath sounds. No decreased breath sounds, wheezing, rhonchi or rales.  Abdominal:     Palpations: Abdomen is soft.     Tenderness: There is no abdominal tenderness. There is no guarding or rebound.  Lymphadenopathy:     Cervical: No cervical adenopathy.     Right cervical: No superficial cervical adenopathy.    Left cervical: No superficial cervical adenopathy.  Neurological:     General: No focal deficit present.     Mental Status: She is alert and oriented to person, place, and time.  Psychiatric:        Mood and Affect: Mood normal.        Behavior: Behavior normal.        Thought Content: Thought content normal.        Judgment: Judgment normal.     UC Treatments / Results  Labs (all labs ordered are listed, but only abnormal results are displayed) Labs Reviewed - No data to display  EKG   Radiology No results found.  Procedures Procedures (including critical care time)  Medications Ordered in UC Medications - No data to display  Initial Impression / Assessment and Plan / UC Course  I have reviewed the triage vital signs and the nursing notes.  Pertinent labs & imaging results that were available during my care of the patient were reviewed by me and considered in my medical decision making (see chart for  details).     This patient is a very pleasant 51 y.o. year old female presenting with pharyngitis and R AOM. Symptoms x5 days. Afebrile, nontachy. Ablation contraception.   Negative home covid test.  Ddx includes strep pharyngitis. As we are already treating the AOM with amoxicillin, did not send strep test. Amoxicillin liquid sent at patient request.   ED return precautions discussed. Patient verbalizes understanding and agreement.   .   Final Clinical Impressions(s) / UC Diagnoses   Final diagnoses:  Non-recurrent acute suppurative otitis media of right ear without spontaneous rupture of tympanic membrane     Discharge Instructions      -Amoxicillin twice daily x7 days -You can take Tylenol up to 1000 mg 3 times daily, and ibuprofen up to 600 mg 3 times daily with food.  You can take these together, or alternate every 3-4 hours. -Follow-up if symptoms worsen/persist      ED Prescriptions     Medication Sig Dispense Auth. Provider   amoxicillin (AMOXIL) 250 MG/5ML suspension Take 17.5 mLs (875 mg total) by mouth 2 (two) times daily for 7 days. 245 mL Hazel Sams, PA-C  PDMP not reviewed this encounter.   Hazel Sams, PA-C 09/13/21 1030

## 2021-09-13 NOTE — ED Triage Notes (Signed)
Pt presents to urgent care for sinus pressure,headache for several days. She thinks she has a sinus infection.
